# Patient Record
Sex: Male | Born: 1965
Health system: Southern US, Community
[De-identification: ages and names within clinical notes are randomized; demographics above are authoritative.]

## PROBLEM LIST (undated history)

## (undated) DIAGNOSIS — K089 Disorder of teeth and supporting structures, unspecified: Secondary | ICD-10-CM

## (undated) DIAGNOSIS — Z7289 Other problems related to lifestyle: Secondary | ICD-10-CM

## (undated) HISTORY — PX: HERNIA REPAIR: SHX51

---
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
## 2000-05-30 ENCOUNTER — Emergency Department (HOSPITAL_COMMUNITY): Admission: EM | Admit: 2000-05-30 | Discharge: 2000-05-30 | Payer: Self-pay | Admitting: Internal Medicine

## 2001-05-13 ENCOUNTER — Encounter: Payer: Self-pay | Admitting: General Surgery

## 2001-05-13 ENCOUNTER — Encounter: Admission: RE | Admit: 2001-05-13 | Discharge: 2001-05-13 | Payer: Self-pay | Admitting: General Surgery

## 2001-05-30 ENCOUNTER — Ambulatory Visit (HOSPITAL_BASED_OUTPATIENT_CLINIC_OR_DEPARTMENT_OTHER): Admission: RE | Admit: 2001-05-30 | Discharge: 2001-05-30 | Payer: Self-pay | Admitting: General Surgery

## 2001-05-30 ENCOUNTER — Encounter (INDEPENDENT_AMBULATORY_CARE_PROVIDER_SITE_OTHER): Payer: Self-pay

## 2004-06-15 ENCOUNTER — Emergency Department (HOSPITAL_COMMUNITY): Admission: EM | Admit: 2004-06-15 | Discharge: 2004-06-15 | Payer: Self-pay | Admitting: Family Medicine

## 2005-04-27 ENCOUNTER — Emergency Department (HOSPITAL_COMMUNITY): Admission: EM | Admit: 2005-04-27 | Discharge: 2005-04-27 | Payer: Self-pay | Admitting: Emergency Medicine

## 2009-08-19 ENCOUNTER — Ambulatory Visit: Payer: Self-pay | Admitting: Internal Medicine

## 2009-09-19 ENCOUNTER — Emergency Department (HOSPITAL_COMMUNITY): Admission: EM | Admit: 2009-09-19 | Discharge: 2009-09-19 | Payer: Self-pay | Admitting: Emergency Medicine

## 2009-09-27 ENCOUNTER — Encounter (INDEPENDENT_AMBULATORY_CARE_PROVIDER_SITE_OTHER): Payer: Self-pay | Admitting: *Deleted

## 2010-05-10 NOTE — Letter (Signed)
Summary: Unable to Reach, Consult Scheduled  [facility]  [address]   [place], [place] [address]   Phone: [phone]  Fax: [phone]    09/27/2009  KYLYN SOOKRAM [address] [place], [place]  [address] 07-17-1965   Dear Mr. Dike,   We have been unable to reach you by phone.  Please contact our office with an updated phone number.  At the recommendation of DR [facility]  we have been asked to schedule you a consult with DR Jena Gauss for DYSPHAGIA. Please call our office at [phone].     Thank you,    Diana Eves  [facility] R. Roetta Sessions, M.D.    Jonette Eva, M.D. Lorenza Burton, FNP-BC    Tana Coast, PA-C Phone: [phone]    Fax: [phone]

## 2010-06-27 LAB — POCT I-STAT, CHEM 8
BUN: 14 mg/dL (ref 6–23)
Calcium, Ion: 1.23 mmol/L (ref 1.12–1.32)
Chloride: 110 mEq/L (ref 96–112)
Creatinine, Ser: 1 mg/dL (ref 0.4–1.5)
Glucose, Bld: 85 mg/dL (ref 70–99)
HCT: 51 % (ref 39.0–52.0)
Hemoglobin: 17.3 g/dL — ABNORMAL HIGH (ref 13.0–17.0)
Potassium: 4.2 mEq/L (ref 3.5–5.1)
Sodium: 143 mEq/L (ref 135–145)
TCO2: 26 mmol/L (ref 0–100)

## 2010-06-27 LAB — RAPID STREP SCREEN (MED CTR MEBANE ONLY): Streptococcus, Group A Screen (Direct): NEGATIVE

## 2010-06-27 LAB — POCT CARDIAC MARKERS

## 2010-08-26 NOTE — Op Note (Signed)
[facility]  Patient:    Edward Delgado, Edward Delgado Visit Number: [id number] MRN: [id number]          Service Type: DSU Location: [facility] Attending Physician:  Arlis Porta Dictated by:   Adolph Pollack, M.D. Proc. Date: 05/30/01 Admit Date:  05/30/2001 Discharge Date: 05/30/2001                             Operative Report  PREOPERATIVE DIAGNOSIS:  Abdominal wall mass.  POSTOPERATIVE DIAGNOSIS:  Abdominal wall abscess.  PROCEDURE:  Excision and drainage of abdominal wall abscess.  SURGEON:  Adolph Pollack, M.D.  ANESTHESIA:  General.  INDICATIONS:  The patient is a 45 year old man who has noticed for the past two or three years that he has a bulge in the supraumbilical area.  Of note, he said he had an operation 10-11 years ago for an incarcerated umbilical hernia requiring small bowel resection.  He noted some redness of the bulge back in late January.  No fever or chills present.  No nausea or vomiting.  No change in bowel habits.  A CT scan demonstrated this to be a suprafascial process.  It was thought that this could potentially be consistent with a periumbilical abscess or an infected cystic lesion.  He is now brought to the operating room.  DESCRIPTION OF PROCEDURE:  He was placed supine on the operating table and general anesthetic was administered.  The abdomen was sterilely prepped and draped.  The mass was palpated with discolored skin and an elliptical incision made around it.  Purulent drainage was noted to come forth.  There was a very firm stalk connected to the mass that went down to the fascial area, but did not go through the fascia.  I excised this with the cautery and excised the mass in toto.  This appeared to be an abscessed cavity, but it did not appear to have a connection to the intra-abdominal contents.  I did excise the entire inflammatory mass and abscessed cavity, and sent it off to pathology.  Next,  because of the purulent drainage, I packed the wound open with moist gauze, and then covered it with a dry bulky dressing.  He tolerated the procedure well without any apparent complications and was taken to the recovery room in satisfactory condition.  Postoperatively, he will see me on his first postoperative day for a dressing change, and we will let the wound heal in by secondary intention. Dictated by:   Adolph Pollack, M.D. Attending Physician:  Arlis Porta DD:  05/30/01 TD:  05/31/01 Job: [id number]

## 2012-05-16 ENCOUNTER — Encounter (HOSPITAL_COMMUNITY): Payer: Self-pay | Admitting: Emergency Medicine

## 2012-05-16 ENCOUNTER — Emergency Department (HOSPITAL_COMMUNITY)
Admission: EM | Admit: 2012-05-16 | Discharge: 2012-05-16 | Disposition: A | Discharge status: Home / Self Care | Payer: Worker's Compensation | Attending: Emergency Medicine | Admitting: Emergency Medicine

## 2012-05-16 DIAGNOSIS — Y990 Civilian activity done for income or pay: Secondary | ICD-10-CM | POA: Insufficient documentation

## 2012-05-16 DIAGNOSIS — S058X9A Other injuries of unspecified eye and orbit, initial encounter: Secondary | ICD-10-CM | POA: Insufficient documentation

## 2012-05-16 DIAGNOSIS — H18899 Other specified disorders of cornea, unspecified eye: Secondary | ICD-10-CM

## 2012-05-16 DIAGNOSIS — Y9389 Activity, other specified: Secondary | ICD-10-CM | POA: Insufficient documentation

## 2012-05-16 DIAGNOSIS — T1590XA Foreign body on external eye, part unspecified, unspecified eye, initial encounter: Secondary | ICD-10-CM | POA: Insufficient documentation

## 2012-05-16 DIAGNOSIS — Y9289 Other specified places as the place of occurrence of the external cause: Secondary | ICD-10-CM | POA: Insufficient documentation

## 2012-05-16 DIAGNOSIS — H53149 Visual discomfort, unspecified: Secondary | ICD-10-CM | POA: Insufficient documentation

## 2012-05-16 DIAGNOSIS — T1500XA Foreign body in cornea, unspecified eye, initial encounter: Secondary | ICD-10-CM | POA: Insufficient documentation

## 2012-05-16 MED ORDER — OXYCODONE-ACETAMINOPHEN 5-325 MG PO TABS
1.0000 | ORAL_TABLET | Freq: Once | ORAL | Status: AC
  Administered 2012-05-16: 1 via ORAL
  Filled 2012-05-16: qty 1

## 2012-05-16 MED ORDER — TETRACAINE HCL 0.5 % OP SOLN
OPHTHALMIC | Status: AC
  Administered 2012-05-16: 09:00:00
  Filled 2012-05-16: qty 2

## 2012-05-16 MED ORDER — FLUORESCEIN SODIUM 1 MG OP STRP
ORAL_STRIP | OPHTHALMIC | Status: AC
  Administered 2012-05-16: 09:00:00
  Filled 2012-05-16: qty 1

## 2012-05-16 MED ORDER — HOMATROPINE HBR 5 % OP SOLN: 1.0000 [drp] | Freq: Once | OPHTHALMIC | Status: DC

## 2012-05-16 NOTE — ED Notes (Signed)
Pt states was at work and thinks a piece of metal got in eye. Pt was wearing safety glasses.watering, pain and discharged noted.

## 2012-05-16 NOTE — ED Provider Notes (Signed)
Medical screening examination/treatment/procedure(s) were performed by non-physician practitioner and as supervising physician I was immediately available for consultation/collaboration.   Carleene Cooper III, MD 05/16/12 2029

## 2012-05-16 NOTE — ED Notes (Signed)
Patient with no complaints at this time. Respirations even and unlabored. Skin warm/dry. Discharge instructions reviewed with patient at this time. Patient given opportunity to voice concerns/ask questions. Patient discharged at this time and left Emergency Department with steady gait. Patient to go straight to Dr Lita Mains Office in [place]. Directions printed and given to patient/family member.

## 2012-05-16 NOTE — ED Notes (Signed)
Awaiting medication from pharmacy.

## 2012-05-16 NOTE — ED Notes (Addendum)
Slit lamp, wood's lamp, tetracaine, and fluorescein strip pulled to bedside for PA assessment.

## 2012-05-16 NOTE — ED Provider Notes (Addendum)
History     CSN: [id number]  Arrival date & time 05/16/12  0818   First MD Initiated Contact with Patient 05/16/12 [phone]      Chief Complaint  Patient presents with  . Foreign Body in Eye    (Consider location/radiation/quality/duration/timing/severity/associated sxs/prior treatment) HPI Comments: Patient c/o foreign body sensation to the left eye after using a torch to cut metal.  States he was wearing safety glasses at the time.  C/o severe pain to his eye, excessive tearing and swelling of the upper eyelid.  States he rinsed his eye with water at the time of the incident.  He denies visual changes, fever, vomiting , headache or contact use.    Patient is a 47 y.o. male presenting with foreign body in eye. The history is provided by the patient.  Foreign Body in Eye This is a new problem. Episode onset: 2 days ago. The problem occurs constantly. The problem has been gradually worsening. Pertinent negatives include no fever, headaches, neck pain, numbness, rash, sore throat, swollen glands, vertigo, vomiting or weakness. Exacerbated by: bright light and blinking. Treatments tried: visine, rinsing his eye with water. The treatment provided no relief.    History reviewed. No pertinent past medical history.  Past Surgical History  Procedure Date  . Hernia repair     No family history on file.  History  Substance Use Topics  . Smoking status: Not on file  . Smokeless tobacco: Not on file  . Alcohol Use:       Review of Systems  Constitutional: Negative for fever, activity change and appetite change.  HENT: Negative for sore throat, facial swelling and neck pain.   Eyes: Positive for photophobia, pain and redness. Negative for discharge, itching and visual disturbance.  Gastrointestinal: Negative for vomiting.  Skin: Negative for rash.  Neurological: Negative for dizziness, vertigo, syncope, weakness, numbness and headaches.  All other systems reviewed and are  negative.    Allergies  Review of patient's allergies indicates no known allergies.  Home Medications  No current outpatient prescriptions on file.  BP 150/92  Pulse 83  Temp 99.2 F (37.3 C) (Oral)  Resp 20  Ht 5\' 6"  (1.676 m)  Wt 170 lb (77.111 kg)  BMI 27.44 kg/m2  SpO2 96%  Physical Exam  Nursing note and vitals reviewed. Constitutional: He is oriented to person, place, and time. He appears well-developed and well-nourished.       Appears uncomfortable  HENT:  Head: Normocephalic and atraumatic.  Eyes: EOM are normal. Pupils are equal, round, and reactive to light. Left eye exhibits no chemosis, no discharge, no exudate and no hordeolum. Foreign body present in the left eye. Right conjunctiva is not injected. Left conjunctiva is injected. Left conjunctiva has no hemorrhage. Right eye exhibits normal extraocular motion and no nystagmus. Left eye exhibits normal extraocular motion and no nystagmus.  Fundoscopic exam:      The left eye shows no papilledema.  Slit lamp exam:      The left eye shows corneal abrasion, foreign body and fluorescein uptake. The left eye shows no corneal ulcer and no hyphema.    Lids were everted and no FB seen  Neck: Normal range of motion. Neck supple.  Cardiovascular: Normal rate, regular rhythm, normal heart sounds and intact distal pulses.   No murmur heard. Pulmonary/Chest: Effort normal and breath sounds normal.  Musculoskeletal: Normal range of motion.  Lymphadenopathy:    He has no cervical adenopathy.  Neurological: He is alert  and oriented to person, place, and time. He exhibits normal muscle tone. Coordination normal.  Skin: Skin is warm and dry.    ED Course  FOREIGN BODY REMOVAL Date/Time: 05/16/2012 9:00 AM Performed by: Trisha Mangle, Kalyiah Saintil L. Authorized by: Maxwell Caul Consent: Verbal consent obtained. written consent not obtained. Risks and benefits: risks, benefits and alternatives were discussed Consent given by:  patient Patient understanding: patient states understanding of the procedure being performed Patient consent: the patient's understanding of the procedure matches consent given Patient identity confirmed: verbally with patient and arm band Time out: Immediately prior to procedure a "time out" was called to verify the correct patient, procedure, equipment, support staff and site/side marked as required. Body area: eye Location details: left conjunctiva Anesthesia method: topical. Local anesthetic: tetracaine drops Anesthetic total: 8 drops Patient sedated: no Patient restrained: no Patient cooperative: yes Localization method: magnification Removal mechanism: moist cotton swab Eye examined with fluorescein. Fluorescein uptake. Corneal abrasion size: small Corneal abrasion location: lateral Residual rust ring present. Depth: superficial Complexity: simple 1 objects recovered. Post-procedure assessment: foreign body removed Patient tolerance: Patient tolerated the procedure well with no immediate complications.   (including critical care time)  Labs Reviewed - No data to display No results found.    Visual acuity OD:  20/20  OS: 20/20  Bilateral :20/20  MDM    FB removed from the left eye, rust ring remains.  Will Consult Dr. Lita Mains to arrange f/u care   Tetracaine drops applied,  and po percocet given in the dept.  (homatropine ordered but none in hospital, been backordered per pharmacy)  10:25 consulted Dr. Lita Mains.  Agrees to see pt in his office now or at 1:30 pm today.        Edward Evrard L. Emori Mumme, PA 05/16/12 1033  Edward Terrio L. Topanga Alvelo, PA 05/16/12 1053  Edward Delgado, [place] 05/29/12 2109

## 2012-05-30 NOTE — ED Provider Notes (Signed)
Medical screening examination/treatment/procedure(s) were performed by non-physician practitioner and as supervising physician I was immediately available for consultation/collaboration.   Carleene Cooper III, MD 05/30/12 [phone]

## 2015-02-22 ENCOUNTER — Encounter (HOSPITAL_COMMUNITY): Payer: Self-pay | Admitting: *Deleted

## 2015-02-22 ENCOUNTER — Emergency Department (HOSPITAL_COMMUNITY)
Admission: EM | Admit: 2015-02-22 | Discharge: 2015-02-22 | Disposition: A | Discharge status: Home / Self Care | Payer: 59 | Attending: Emergency Medicine | Admitting: Emergency Medicine

## 2015-02-22 DIAGNOSIS — Z87828 Personal history of other (healed) physical injury and trauma: Secondary | ICD-10-CM | POA: Diagnosis not present

## 2015-02-22 DIAGNOSIS — M25562 Pain in left knee: Secondary | ICD-10-CM | POA: Diagnosis present

## 2015-02-22 MED ORDER — HYDROCODONE-ACETAMINOPHEN 5-325 MG PO TABS: 1.0000 | ORAL_TABLET | ORAL | Status: DC | PRN

## 2015-02-22 NOTE — ED Provider Notes (Signed)
CSN: [id number]     Arrival date & time 02/22/15  1216 History  By signing my name below, I, Ronney LionSuzanne Le, attest that this documentation has been prepared under the direction and in the presence of [facility] Hospital Medical CenterEmily Kmya Placide, PA-C. Electronically Signed: Ronney LionSuzanne Le, ED Scribe. 02/22/2015. 4:26 PM.    Chief Complaint  Patient presents with  . Knee Pain   The history is provided by the patient. No language interpreter was used.   HPI Comments: Edward Delgado is a 49 y.o. male who presents to the Emergency Department complaining of diffuse, pulling, severe left knee pain that began 2 months ago after twisting his knee while falling off of a tractor-trailer truck at work. Patient was seen for this at his PCP's office 2 months ago and had x-rays done that were negative. He was diagnosed with a sprain at the time but since being seen, complains of continuing pain and swelling. He states his left knee clicks when he walks. His significant other states he has had trouble sleeping secondary to his discomfort and often tosses and turns at night. Certain movements exacerbate his pain. He has taken Tylenol, Advil, and Goody's powder with no relief. He denies fever, back pain, weakness, or numbness. He states he has never seen an orthopedist. 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  History reviewed. No pertinent past medical history. Past Surgical History  Procedure Laterality Date  . Hernia repair     History reviewed. No pertinent family history. Social History  Substance Use Topics  . Smoking status: Never Smoker   . Smokeless tobacco: None  . Alcohol Use: Yes     Comment: occ    Review of Systems  Constitutional: Negative for fever.  Cardiovascular: Negative for leg swelling.  Musculoskeletal: Positive for arthralgias. Negative for myalgias and back pain.  Skin: Negative for color change and wound.  Allergic/Immunologic: Negative for  immunocompromised state.  Neurological: Negative for weakness and numbness.  Hematological: Does not bruise/bleed easily.  Psychiatric/Behavioral: Negative for self-injury.    Allergies  Review of patient's allergies indicates no known allergies.  Home Medications   Prior to Admission medications   Not on File   BP 157/93 mmHg  Pulse 92  Temp(Src) 98.3 F (36.8 C) (Oral)  Resp 16  SpO2 98% Physical Exam  Constitutional: He appears well-developed and well-nourished. No distress.  HENT:  Head: Normocephalic and atraumatic.  Neck: Neck supple.  Pulmonary/Chest: Effort normal.  Musculoskeletal: He exhibits tenderness.  Thessaly test is positive. Mild pain with anterior,posteroir, varus, and valgus stressing, without laxity. No erythema, edema, warmth or focal TTP. Distal pulses and sensation are intact. Compartments are soft.   Neurological: He is alert.  Skin: He is not diaphoretic.  Nursing note and vitals reviewed.   ED Course  Procedures (including critical care time)  DIAGNOSTIC STUDIES: Oxygen Saturation is 98% on RA, normal by my interpretation.    COORDINATION OF CARE: 1:23 PM - Discussed treatment plan with pt at bedside which includes crutches and referral to orthopedist for f/u. Will give work note. Ptverbalized understanding and agreed to plan.   MDM   Final diagnoses:  Left knee pain   Afebrile, nontoxic patient with injury to his left knee while twisting it while falling from a truck two months ago.  Persistent pain.   Xray performed by PCP was negative.  Suspect meniscal injury.   D/C home with knee sleeve, crutches,  vicodin, orthopedic follow up.   Discussed result, findings, treatment, and follow up  with patient.  Pt given return precautions.  Pt verbalizes understanding and agrees with plan.     I personally performed the services described in this documentation, which was scribed in my presence. The recorded information has been reviewed and is  accurate.   Trixie Dredge, PA-C 02/22/15 2706  Gerhard Munch, MD 02/23/15 [phone]

## 2015-02-22 NOTE — ED Notes (Signed)
Pt reports injuring left knee two months ago and was seen at pcp and had xrays done, was told it was sprained but still having pain and swelling.

## 2015-02-22 NOTE — Discharge Instructions (Signed)
Read the information below.  Use the prescribed medication as directed.  Please discuss all new medications with your pharmacist.  Do not take additional tylenol while taking the prescribed pain medication to avoid overdose.  You may return to the Emergency Department at any time for worsening condition or any new symptoms that concern you.   If you develop uncontrolled pain, weakness or numbness of the extremity, severe discoloration of the skin, or you are unable to walk, return to the ER for a recheck.      How to Use a Knee Brace A knee brace is a device that you wear to support your knee, especially if the knee is healing after an injury or surgery. There are several types of knee braces. Some are designed to prevent an injury (prophylactic brace). These are often worn during sports. Others support an injured knee (functional brace) or keep it still while it heals (rehabilitative brace). People with severe arthritis of the knee may benefit from a brace that takes some pressure off the knee (unloader brace). Most knee braces are made from a combination of cloth and metal or plastic.  You may need to wear a knee brace to:  Relieve knee pain.  Help your knee support your weight (improve stability).  Help you walk farther (improve mobility).  Prevent injury.  Support your knee while it heals from surgery or from an injury. RISKS AND COMPLICATIONS Generally, knee braces are very safe to wear. However, problems may occur, including:  Skin irritation that may lead to infection.  Making your condition worse if you wear the brace in the wrong way. HOW TO USE A KNEE BRACE Different braces will have different instructions for use. Your health care provider will tell you or show you:  How to put on your brace.  How to adjust the brace.  When and how often to wear the brace.  How to remove the brace.  If you will need any assistive devices in addition to the brace, such as crutches or a  cane. In general, your brace should:  Have the hinge of the brace line up with the bend of your knee.  Have straps, hooks, or tapes that fasten snugly around your leg.  Not feel too tight or too loose. HOW TO CARE FOR A KNEE BRACE  Check your brace often for signs of damage, such as loose connections or attachments. Your knee brace may get damaged or wear out during normal use.  Wash the fabric parts of your brace with soap and water.  Read the insert that comes with your brace for other specific care instructions. SEEK MEDICAL CARE IF:  Your knee brace is too loose or too tight and you cannot adjust it.  Your knee brace causes skin redness, swelling, bruising, or irritation.  Your knee brace is not helping.  Your knee brace is making your knee pain worse.   This information is not intended to replace advice given to you by your health care provider. Make sure you discuss any questions you have with your health care provider.   Document Released: 06/17/2003 Document Revised: 12/16/2014 Document Reviewed: 07/20/2014 Elsevier Interactive Patient Education 2016 Elsevier Inc.  Knee Pain Knee pain is a very common symptom and can have many causes. Knee pain often goes away when you follow your health care provider's instructions for relieving pain and discomfort at home. However, knee pain can develop into a condition that needs treatment. Some conditions may include:  Arthritis caused  by wear and tear (osteoarthritis).  Arthritis caused by swelling and irritation (rheumatoid arthritis or gout).  A cyst or growth in your knee.  An infection in your knee joint.  An injury that will not heal.  Damage, swelling, or irritation of the tissues that support your knee (torn ligaments or tendinitis). If your knee pain continues, additional tests may be ordered to diagnose your condition. Tests may include X-rays or other imaging studies of your knee. You may also need to have fluid  removed from your knee. Treatment for ongoing knee pain depends on the cause, but treatment may include:  Medicines to relieve pain or swelling.  Steroid injections in your knee.  Physical therapy.  Surgery. HOME CARE INSTRUCTIONS  Take medicines only as directed by your health care provider.  Rest your knee and keep it raised (elevated) while you are resting.  Do not do things that cause or worsen pain.  Avoid high-impact activities or exercises, such as running, jumping rope, or doing jumping jacks.  Apply ice to the knee area:  Put ice in a plastic bag.  Place a towel between your skin and the bag.  Leave the ice on for 20 minutes, 2-3 times a day.  Ask your health care provider if you should wear an elastic knee support.  Keep a pillow under your knee when you sleep.  Lose weight if you are overweight. Extra weight can put pressure on your knee.  Do not use any tobacco products, including cigarettes, chewing tobacco, or electronic cigarettes. If you need help quitting, ask your health care provider. Smoking may slow the healing of any bone and joint problems that you may have. SEEK MEDICAL CARE IF:  Your knee pain continues, changes, or gets worse.  You have a fever along with knee pain.  Your knee buckles or locks up.  Your knee becomes more swollen. SEEK IMMEDIATE MEDICAL CARE IF:   Your knee joint feels hot to the touch.  You have chest pain or trouble breathing.   This information is not intended to replace advice given to you by your health care provider. Make sure you discuss any questions you have with your health care provider.   Document Released: 01/22/2007 Document Revised: 04/17/2014 Document Reviewed: 11/10/2013 Elsevier Interactive Patient Education [employer] Elsevier Inc.

## 2015-03-11 ENCOUNTER — Other Ambulatory Visit: Payer: Self-pay | Admitting: Orthopedic Surgery

## 2015-03-11 DIAGNOSIS — M25561 Pain in right knee: Secondary | ICD-10-CM

## 2015-03-22 ENCOUNTER — Ambulatory Visit
Admission: RE | Admit: 2015-03-22 | Discharge: 2015-03-22 | Disposition: A | Discharge status: Home / Self Care | Payer: 59 | Source: Ambulatory Visit | Attending: Orthopedic Surgery | Admitting: Orthopedic Surgery

## 2015-03-22 DIAGNOSIS — M25561 Pain in right knee: Secondary | ICD-10-CM

## 2015-03-27 ENCOUNTER — Ambulatory Visit
Admission: RE | Admit: 2015-03-27 | Discharge: 2015-03-27 | Disposition: A | Discharge status: Home / Self Care | Payer: 59 | Source: Ambulatory Visit | Attending: Orthopedic Surgery | Admitting: Orthopedic Surgery

## 2015-03-27 DIAGNOSIS — M25561 Pain in right knee: Secondary | ICD-10-CM

## 2016-05-07 IMAGING — MR MR KNEE*L* W/O CM
5 of 6 series · 33 of 40 positions shown · non-contrast
Comparison: None.

CLINICAL DATA: Medial knee pain since falling from a tractor at
work 4 months ago. Weakness with limited weight-bearing. No previous
relevant surgery. Initial encounter.

EXAM:
MRI OF THE LEFT KNEE WITHOUT CONTRAST
TECHNIQUE: Multiplanar, multisequence MR imaging of the knee was performed. No
intravenous contrast was administered.

[Series 6: PD fat-sat · axial · left · 3.0mm · 0.39mm/px · z∈[+10,+125]mm · 8 of 36 slices shown (1 of 3)]
[im 1/36]
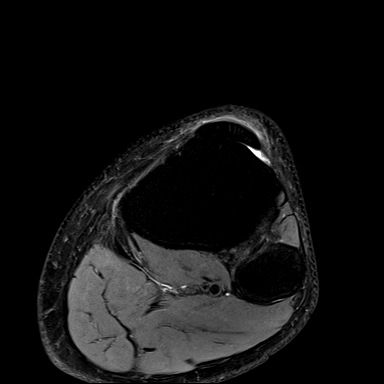
[im 6/36]
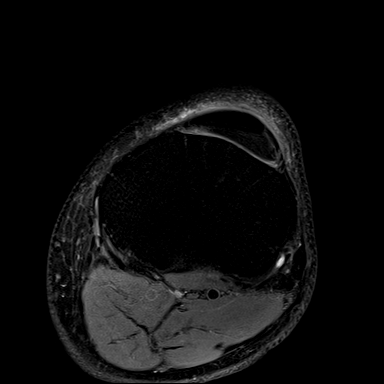
[im 11/36]
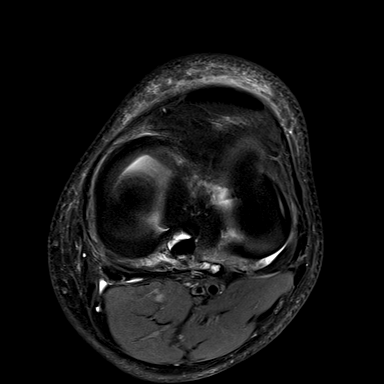
[im 16/36]
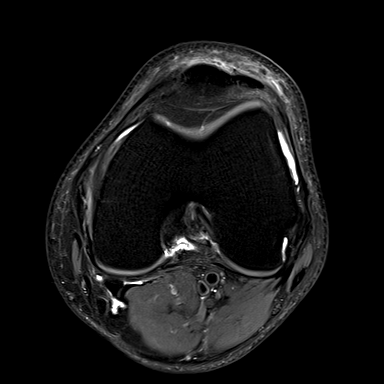
[im 21/36]
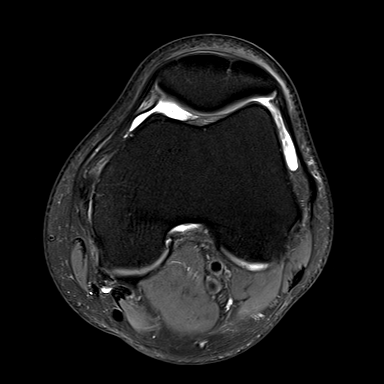
[im 26/36]
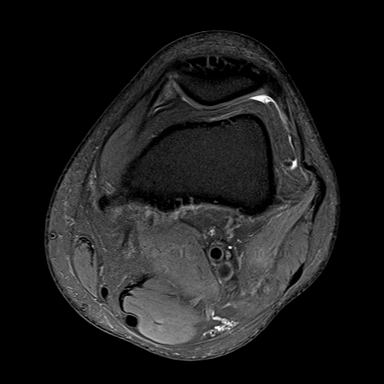
[im 31/36]
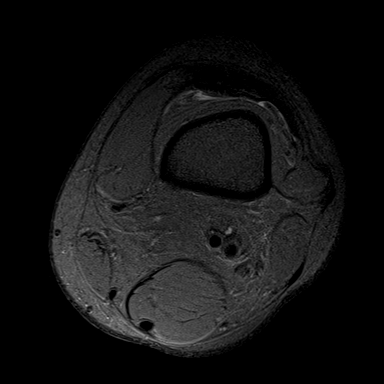
[im 36/36]
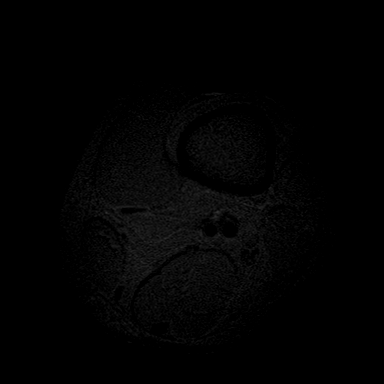

[Series 8: PD fat-sat · coronal · left · 3.0mm · 0.33mm/px · 7 of 33 slices shown (2 of 3)]
[im 1/33]
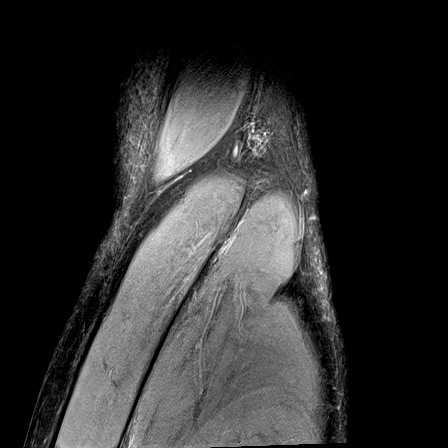
[im 6/33]
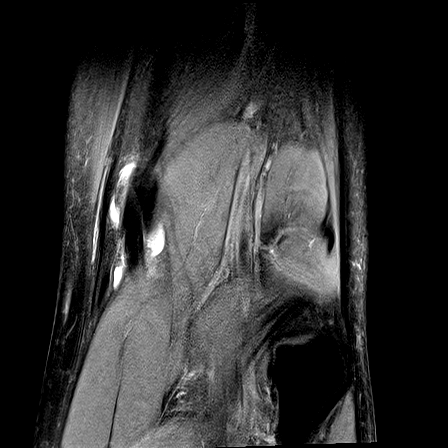
[im 11/33]
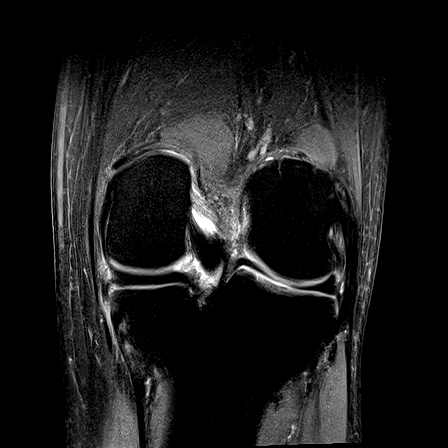
[im 17/33]
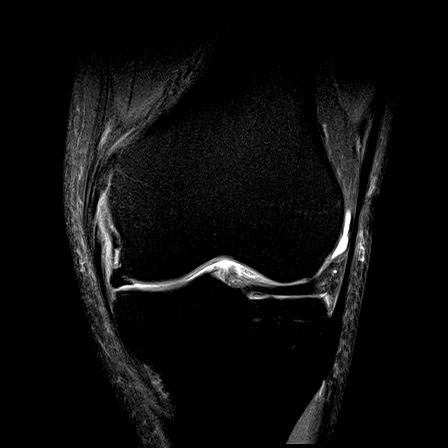
[im 22/33]
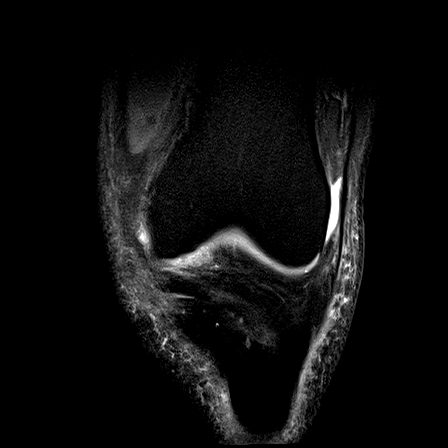
[im 27/33]
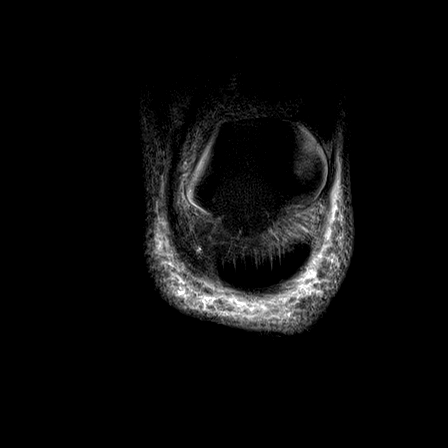
[im 33/33]
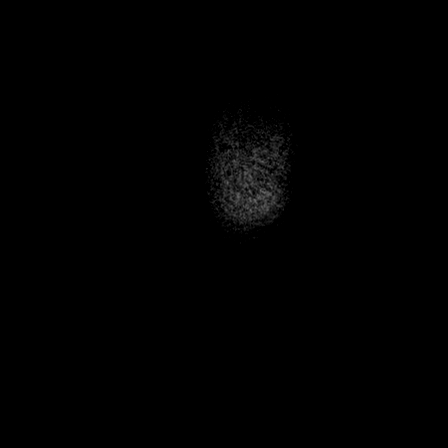

[Series 9: PD fat-sat · sagittal · left · 3.0mm · 0.39mm/px · 6 of 27 slices shown (3 of 3)]
[im 1/27]
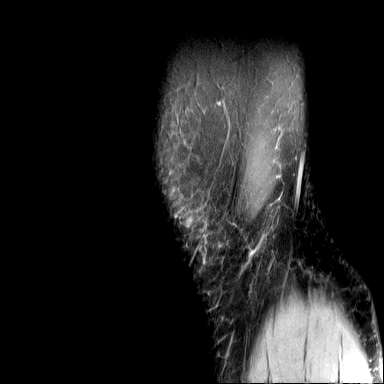
[im 6/27]
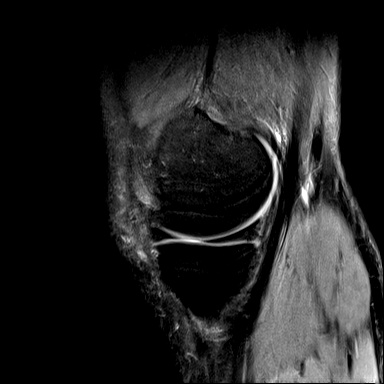
[im 11/27]
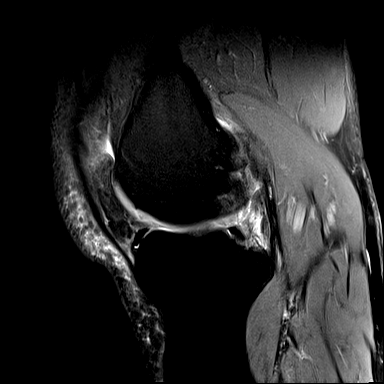
[im 16/27]
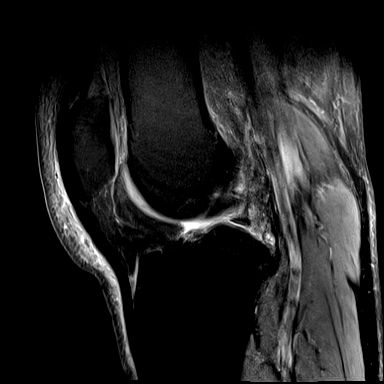
[im 21/27]
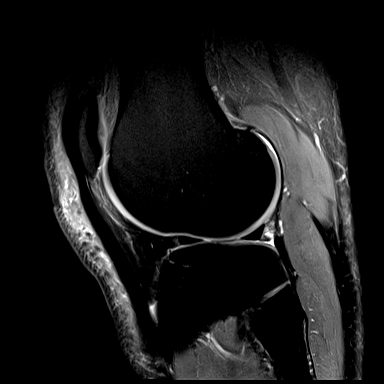
[im 27/27]
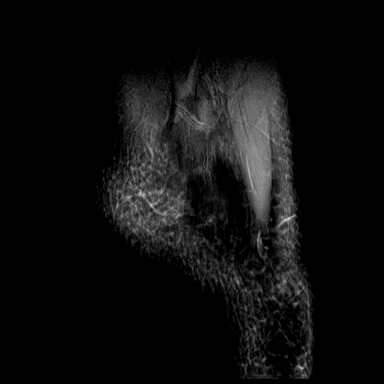

[Series 10: T2 fat-sat · coronal · left · 3.0mm · 0.39mm/px · 7 of 33 slices shown]
[im 1/33]
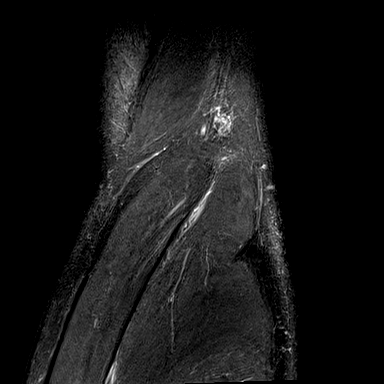
[im 6/33]
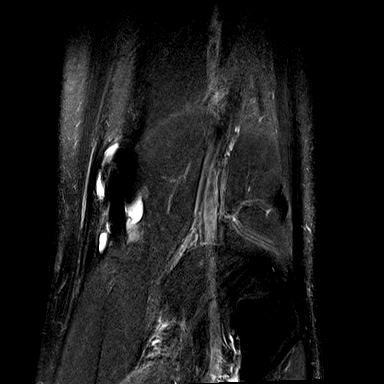
[im 11/33]
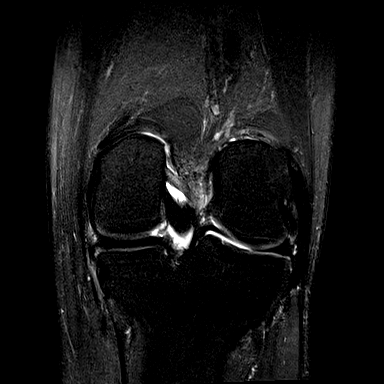
[im 17/33]
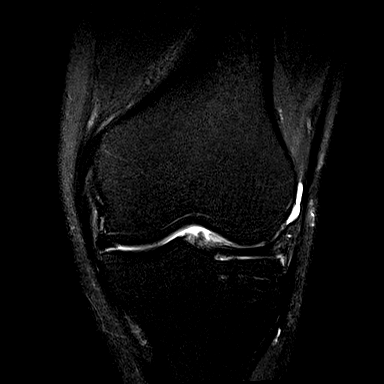
[im 22/33]
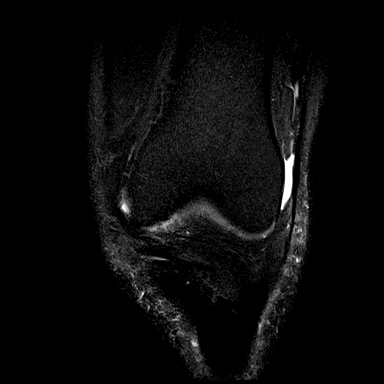
[im 27/33]
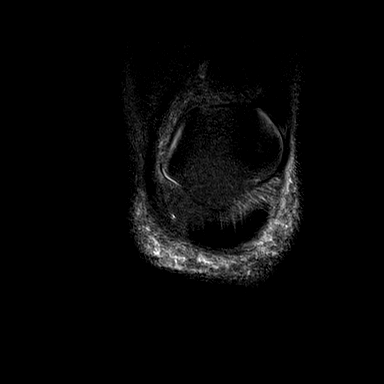
[im 33/33]
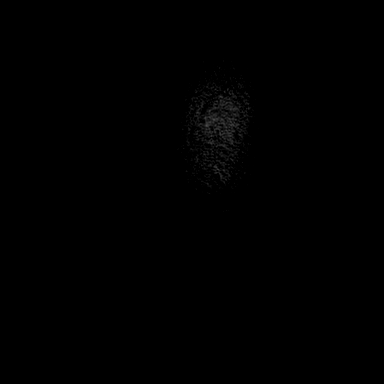

[Series 11: PD · oblique · left · 1.5mm · 0.44mm/px · 5 of 21 slices shown]
[im 1/21]
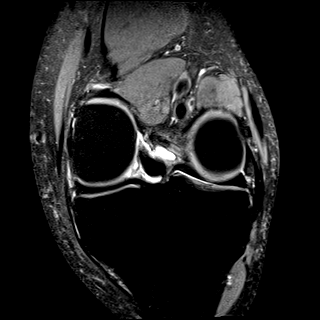
[im 6/21]
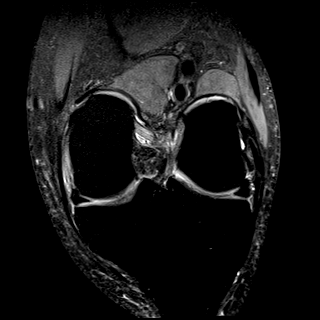
[im 11/21]
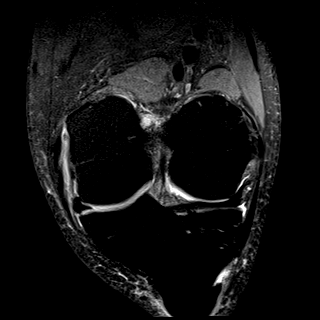
[im 16/21]
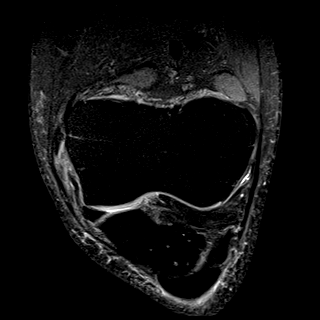
[im 21/21]
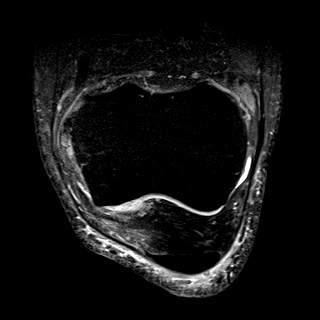

[33 of 40 positions shown; findings below may reference images not displayed]

FINDINGS: Despite efforts by the technologist and patient, motion artifact is
present on today's exam and could not be eliminated. This reduces
exam sensitivity and specificity.

MENISCI

Medial meniscus:  Intact with normal morphology.

Lateral meniscus:  Intact with normal morphology.

LIGAMENTS

Cruciates:  Intact.

Collaterals: The medial collateral ligament is thickened and
edematous proximally, likely representing a healing grade 2 injury.
No full-thickness tear or ligament retraction identified. The
lateral collateral ligament complex appears normal.

CARTILAGE

Patellofemoral:  Preserved.

Medial:  Preserved.

Lateral:  Preserved.

OTHER

Joint:  No significant joint effusion.

Popliteal Fossa:  Small Baker's cyst.

Extensor Mechanism: Intact. There is moderate prepatellar
subcutaneous edema and ill-defined fluid.

Bones:  No acute or significant extra-articular osseous findings.
IMPRESSION: 1. Findings suggest a healing grade 2 MCL sprain.
2. Moderate prepatellar subcutaneous edema, possibly reflecting
posttraumatic bursitis.
3. No acute osseous findings. The menisci and cruciate ligaments
appear normal.

## 2017-07-03 DIAGNOSIS — R030 Elevated blood-pressure reading, without diagnosis of hypertension: Secondary | ICD-10-CM | POA: Diagnosis not present

## 2017-07-03 DIAGNOSIS — M25522 Pain in left elbow: Secondary | ICD-10-CM | POA: Diagnosis not present

## 2017-08-20 DIAGNOSIS — M25561 Pain in right knee: Secondary | ICD-10-CM | POA: Diagnosis not present

## 2017-08-29 ENCOUNTER — Ambulatory Visit (INDEPENDENT_AMBULATORY_CARE_PROVIDER_SITE_OTHER): Payer: 59 | Admitting: Orthopedic Surgery

## 2017-08-29 ENCOUNTER — Ambulatory Visit (INDEPENDENT_AMBULATORY_CARE_PROVIDER_SITE_OTHER): Payer: 59

## 2017-08-29 ENCOUNTER — Encounter (INDEPENDENT_AMBULATORY_CARE_PROVIDER_SITE_OTHER): Payer: Self-pay | Admitting: Orthopedic Surgery

## 2017-08-29 DIAGNOSIS — M25561 Pain in right knee: Secondary | ICD-10-CM | POA: Diagnosis not present

## 2017-08-29 DIAGNOSIS — G8929 Other chronic pain: Secondary | ICD-10-CM | POA: Diagnosis not present

## 2017-08-29 NOTE — Progress Notes (Signed)
Office Visit Note   Patient: Edward Delgado           Date of Birth: 23-Oct-1965           MRN: [id number] Visit Date: 08/29/2017 Requested by: No referring provider defined for this encounter. PCP: Patient, No Pcp Per  Subjective: Chief Complaint  Patient presents with  . Right Knee - Pain    HPI: Edward Delgado is a patient with right knee pain.  I saw him in 2016.  Left knee was symptomatic at that time but MRI scan showed no arthritis only MCL sprain.  He has had cortisone injections which did not help.  Reports pain swelling popping and crunching in the right knee particularly on the medial side.  Went to urgent care and also went to [facility] 08/20/2017.  There he was told that he has bone-on-bone arthritis and may need knee replacement.  This is per patient history.  Not cooperated in the medical record although I could not find a note.  Voltaren has been tried which has not helped.  Reports chronic pain.  Uses ice and heat.  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              ROS: All systems reviewed are negative as they relate to the chief complaint within the history of present illness.  Patient denies  fevers or chills.   Assessment & Plan: Visit Diagnoses:  1. Chronic pain of right knee     Plan: Impression is fairly normal right knee exam and radiographs.  Pain is definitely more severe by history then it is by objective findings today.  Has been going on for many months and he has failed conservative management.  Need MRI scan right knee to evaluate for medial meniscal tear due to some mechanical symptoms he was having and describing.  I will see him back after that study.  Follow-Up Instructions: Return for after MRI.   Orders:  Orders Placed This Encounter  Procedures  . XR KNEE 3 VIEW RIGHT  . MR Knee Right w/o contrast   No orders of the defined types were placed in this encounter.     Procedures: No procedures performed   Clinical Data: No  additional findings.  Objective: Vital Signs: There were no vitals taken for this visit.  Physical Exam:   Constitutional: Patient appears well-developed HEENT:  Head: Normocephalic Eyes:EOM are normal Neck: Normal range of motion Cardiovascular: Normal rate Pulmonary/chest: Effort normal Neurologic: Patient is alert Skin: Skin is warm Psychiatric: Patient has normal mood and affect    Ortho Exam: Orthopedic exam demonstrates pretty normal gait and alignment with no effusion in either knee.  Range of motion is full with no flexion contracture in the right or left hand side.  Pedal pulses palpable.  Collateral and cruciate ligaments are stable.  No discrete joint line tenderness noted on the right left-hand side.  Specialty Comments:  No specialty comments available.  Imaging: Xr Knee 3 View Right  Result Date: 08/29/2017 AP lateral merchant right knee reviewed.  Fairly minimal degenerative changes present.  Possible mild medial joint space narrowing noted on the lateral view.  No spurring present.  No effusion.  No fracture.    PMFS History: There are no active problems to display for this patient.  History reviewed. No pertinent past medical history.  History reviewed. No pertinent family history.  Past Surgical History:  Procedure Laterality Date  . HERNIA REPAIR  Social History   Occupational History  . Not on file  Tobacco Use  . Smoking status: Never Smoker  Substance and Sexual Activity  . Alcohol use: Yes    Comment: occ  . Drug use: No  . Sexual activity: Not on file

## 2017-09-12 ENCOUNTER — Ambulatory Visit
Admission: RE | Admit: 2017-09-12 | Discharge: 2017-09-12 | Disposition: A | Discharge status: Home / Self Care | Payer: 59 | Source: Ambulatory Visit | Attending: Orthopedic Surgery | Admitting: Orthopedic Surgery

## 2017-09-12 DIAGNOSIS — M25561 Pain in right knee: Principal | ICD-10-CM

## 2017-09-12 DIAGNOSIS — G8929 Other chronic pain: Secondary | ICD-10-CM

## 2017-09-19 ENCOUNTER — Ambulatory Visit (INDEPENDENT_AMBULATORY_CARE_PROVIDER_SITE_OTHER): Payer: 59 | Admitting: Orthopedic Surgery

## 2017-09-19 ENCOUNTER — Encounter (INDEPENDENT_AMBULATORY_CARE_PROVIDER_SITE_OTHER): Payer: Self-pay | Admitting: Orthopedic Surgery

## 2017-09-19 DIAGNOSIS — S838X1D Sprain of other specified parts of right knee, subsequent encounter: Secondary | ICD-10-CM | POA: Diagnosis not present

## 2017-09-22 ENCOUNTER — Encounter (INDEPENDENT_AMBULATORY_CARE_PROVIDER_SITE_OTHER): Payer: Self-pay | Admitting: Orthopedic Surgery

## 2017-09-22 NOTE — Progress Notes (Signed)
   Office Visit Note   Patient: Edward Delgado           Date of Birth: 11/23/1965           MRN: [id number] Visit Date: 09/19/2017 Requested by: No referring provider defined for this encounter. PCP: Patient, No Pcp Per  Subjective: Chief Complaint  Patient presents with  . Right Knee - Follow-up    HPI: Edward Delgado is a patient with right knee pain who presents for evaluation after MRI scan.  MRI scan is reviewed and it does show medial meniscal tear along with some mild chondromalacia on the medial femoral condyle.  All in all he does not have end-stage bone-on-bone knee arthritis.  He reports continued mechanical symptoms and pain on the medial aspect of the knee.  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              ROS: All systems reviewed are negative as they relate to the chief complaint within the history of present illness.  Patient denies  fevers or chills.   Assessment & Plan: Visit Diagnoses:  1. Injury of meniscus of right knee, subsequent encounter     Plan: Impression is symptomatic right knee medial meniscal tear refractory to nonoperative management including injections and activity modification and medication.  I think he would do well with arthroscopy but would need to be out of work probably 4 to 6 weeks from his heavy physical type of duty.  The risk and benefits are discussed including but not limited to infection nerve vessel damage knee stiffness and incomplete pain relief.  Patient understands risk benefits.  All questions answered.  Follow-Up Instructions: No follow-ups on file.   Orders:  No orders of the defined types were placed in this encounter.  No orders of the defined types were placed in this encounter.     Procedures: No procedures performed   Clinical Data: No additional findings.  Objective: Vital Signs: There were no vitals taken for this visit.  Physical Exam:   Constitutional:  Patient appears well-developed HEENT:  Head: Normocephalic Eyes:EOM are normal Neck: Normal range of motion Cardiovascular: Normal rate Pulmonary/chest: Effort normal Neurologic: Patient is alert Skin: Skin is warm Psychiatric: Patient has normal mood and affect    Ortho Exam: Orthopedic exam demonstrates full active and passive range of motion of the right knee with mild effusion medial joint line tenderness stable collateral crucial ligaments and intact extensor mechanism.  Pedal pulses palpable.  No groin pain with internal/external rotation of the leg.  No other masses lymph adenopathy or skin changes noted in that right knee region.  Specialty Comments:  No specialty comments available.  Imaging: No results found.   PMFS History: There are no active problems to display for this patient.  History reviewed. No pertinent past medical history.  History reviewed. No pertinent family history.  Past Surgical History:  Procedure Laterality Date  . HERNIA REPAIR     Social History   Occupational History  . Not on file  Tobacco Use  . Smoking status: Never Smoker  . Smokeless tobacco: Never Used  Substance and Sexual Activity  . Alcohol use: Yes    Comment: occ  . Drug use: No  . Sexual activity: Not on file

## 2017-10-01 ENCOUNTER — Encounter (INDEPENDENT_AMBULATORY_CARE_PROVIDER_SITE_OTHER): Payer: Self-pay | Admitting: Orthopedic Surgery

## 2017-10-01 ENCOUNTER — Inpatient Hospital Stay (INDEPENDENT_AMBULATORY_CARE_PROVIDER_SITE_OTHER): Payer: 59 | Admitting: Orthopedic Surgery

## 2017-10-01 ENCOUNTER — Encounter: Payer: Self-pay | Admitting: Orthopedic Surgery

## 2017-10-01 DIAGNOSIS — M94261 Chondromalacia, right knee: Secondary | ICD-10-CM | POA: Diagnosis not present

## 2017-10-01 DIAGNOSIS — M23221 Derangement of posterior horn of medial meniscus due to old tear or injury, right knee: Secondary | ICD-10-CM | POA: Diagnosis not present

## 2017-10-01 DIAGNOSIS — G8918 Other acute postprocedural pain: Secondary | ICD-10-CM | POA: Diagnosis not present

## 2017-10-03 ENCOUNTER — Telehealth (INDEPENDENT_AMBULATORY_CARE_PROVIDER_SITE_OTHER): Payer: Self-pay | Admitting: Orthopedic Surgery

## 2017-10-03 NOTE — Telephone Encounter (Signed)
Edward Delgado, patients fiance called to see if they could get documentation like a doctors note of patients beginning date out of work and the end date, this is for short term disability. CB # [phone]

## 2017-10-03 NOTE — Telephone Encounter (Signed)
Out of work at least 5 weeks from the time of surgery

## 2017-10-03 NOTE — Telephone Encounter (Signed)
Kathie RhodesBetty, pts girlfriend calling for patient. He is completing forms and needs to know how long he is going to be out of work and the start date of oow..callback @ [phone]

## 2017-10-03 NOTE — Telephone Encounter (Signed)
Please advise. Thanks.  

## 2017-10-03 NOTE — Telephone Encounter (Signed)
See other note. Waiting to be advised by Dr Dean 

## 2017-10-03 NOTE — Telephone Encounter (Signed)
I tried calling, no answer LMVM advising per Dr Dean 

## 2017-10-08 ENCOUNTER — Encounter (INDEPENDENT_AMBULATORY_CARE_PROVIDER_SITE_OTHER): Payer: Self-pay | Admitting: Orthopedic Surgery

## 2017-10-08 ENCOUNTER — Ambulatory Visit (INDEPENDENT_AMBULATORY_CARE_PROVIDER_SITE_OTHER): Payer: 59 | Admitting: Orthopedic Surgery

## 2017-10-08 DIAGNOSIS — S838X1D Sprain of other specified parts of right knee, subsequent encounter: Secondary | ICD-10-CM

## 2017-10-08 DIAGNOSIS — H401123 Primary open-angle glaucoma, left eye, severe stage: Secondary | ICD-10-CM | POA: Diagnosis not present

## 2017-10-08 DIAGNOSIS — H401111 Primary open-angle glaucoma, right eye, mild stage: Secondary | ICD-10-CM | POA: Diagnosis not present

## 2017-10-08 MED ORDER — OXYCODONE HCL 5 MG PO TABS: 5.0000 mg | ORAL_TABLET | Freq: Three times a day (TID) | ORAL | 0 refills | Status: DC | PRN

## 2017-10-09 ENCOUNTER — Encounter (INDEPENDENT_AMBULATORY_CARE_PROVIDER_SITE_OTHER): Payer: Self-pay | Admitting: Orthopedic Surgery

## 2017-10-09 NOTE — Progress Notes (Signed)
   Post-Op Visit Note   Patient: Edward Delgado           Date of Birth: 01/08/1966           MRN: [id number] Visit Date: 10/08/2017 PCP: Patient, No Pcp Per   Assessment & Plan:  Chief Complaint:  Chief Complaint  Patient presents with  . Right Knee - Routine Post Op   Visit Diagnoses:  1. Injury of meniscus of right knee, subsequent encounter     Plan: Edward Delgado is a patient with right knee arthroscopy a week ago.  Partial medial meniscectomy.  On examination he has fairly minimal effusion and flexion past 90.  Plan is to start stationary bike and leg strengthening exercises.  Pain medicine refill x1.  Sutures discontinued.  4-week return for clinical recheck.  Follow-Up Instructions: Return in about 1 month (around 11/05/2017).   Orders:  No orders of the defined types were placed in this encounter.  Meds ordered this encounter  Medications  . oxyCODONE (OXY IR/ROXICODONE) 5 MG immediate release tablet    Sig: Take 1 tablet (5 mg total) by mouth every 8 (eight) hours as needed for severe pain.    Dispense:  30 tablet    Refill:  0    Imaging: No results found.  PMFS History: There are no active problems to display for this patient.  History reviewed. No pertinent past medical history.  History reviewed. No pertinent family history.  Past Surgical History:  Procedure Laterality Date  . HERNIA REPAIR     Social History   Occupational History  . Not on file  Tobacco Use  . Smoking status: Never Smoker  . Smokeless tobacco: Never Used  Substance and Sexual Activity  . Alcohol use: Yes    Comment: occ  . Drug use: No  . Sexual activity: Not on file

## 2017-10-22 ENCOUNTER — Telehealth (INDEPENDENT_AMBULATORY_CARE_PROVIDER_SITE_OTHER): Payer: Self-pay | Admitting: Orthopedic Surgery

## 2017-10-22 MED ORDER — OXYCODONE HCL 5 MG PO TABS: ORAL_TABLET | ORAL | 0 refills | Status: DC

## 2017-10-22 NOTE — Telephone Encounter (Signed)
Ok to rf? 

## 2017-10-22 NOTE — Telephone Encounter (Signed)
Patient called requesting an RX refill on his Oxycodone.  CB#[phone].  Thank you

## 2017-10-22 NOTE — Telephone Encounter (Signed)
IC LM advising could pick up rx at front desk 

## 2017-10-22 NOTE — Telephone Encounter (Signed)
Ok to rf 1 po q 8 3 30  PLS CLA HTX

## 2017-11-05 ENCOUNTER — Encounter (INDEPENDENT_AMBULATORY_CARE_PROVIDER_SITE_OTHER): Payer: Self-pay | Admitting: Orthopedic Surgery

## 2017-11-05 ENCOUNTER — Ambulatory Visit (INDEPENDENT_AMBULATORY_CARE_PROVIDER_SITE_OTHER): Payer: 59 | Admitting: Orthopedic Surgery

## 2017-11-05 DIAGNOSIS — S838X1D Sprain of other specified parts of right knee, subsequent encounter: Secondary | ICD-10-CM

## 2017-11-05 NOTE — Progress Notes (Signed)
   Post-Op Visit Note   Patient: Edward Delgado           Date of Birth: 04/13/1965           MRN: [id number] Visit Date: 11/05/2017 PCP: Patient, No Pcp Per   Assessment & Plan:  Chief Complaint:  Chief Complaint  Patient presents with  . Right Knee - Follow-up   Visit Diagnoses:  1. Injury of meniscus of right knee, subsequent encounter     Plan: Edward Delgado is a patient with right knee pain.  He is now about a month out right knee arthroscopy.  He is doing well but is making slow progress.  On examination he has good range of motion and moderate effusion.  He is off pain medicine as of yesterday.  He is going to take anti-inflammatories.  I will check him back in about 3 weeks and we may aspirate the knee at that time.  He does do tile work.  He will be ready for that until at least a month or 2.  Follow-Up Instructions: Return in about 3 weeks (around 11/26/2017).   Orders:  No orders of the defined types were placed in this encounter.  No orders of the defined types were placed in this encounter.   Imaging: No results found.  PMFS History: There are no active problems to display for this patient.  History reviewed. No pertinent past medical history.  History reviewed. No pertinent family history.  Past Surgical History:  Procedure Laterality Date  . HERNIA REPAIR     Social History   Occupational History  . Not on file  Tobacco Use  . Smoking status: Never Smoker  . Smokeless tobacco: Never Used  Substance and Sexual Activity  . Alcohol use: Yes    Comment: occ  . Drug use: No  . Sexual activity: Not on file

## 2017-11-06 ENCOUNTER — Telehealth (INDEPENDENT_AMBULATORY_CARE_PROVIDER_SITE_OTHER): Payer: Self-pay | Admitting: Orthopedic Surgery

## 2017-11-06 NOTE — Telephone Encounter (Signed)
Patient's (Partner) Myriam JacobsonHelen called stating the patient's employer [employer] need to know how long will the patient be out of work. The employer also need to know any and all restrictions for the patient. The claim # is [id number]  Attention Lorin PicketScott    At the benefits center   The fax# is [phone]    The number to contact patient is [phone]

## 2017-11-06 NOTE — Telephone Encounter (Signed)
Please advise, thanks.

## 2017-11-08 NOTE — Telephone Encounter (Signed)
Oow for 6 more weeks pls claal htx

## 2017-11-09 NOTE — Telephone Encounter (Signed)
Work note entered. I called patient and advised. Faxed per patient request.

## 2017-11-26 ENCOUNTER — Ambulatory Visit (INDEPENDENT_AMBULATORY_CARE_PROVIDER_SITE_OTHER): Payer: 59 | Admitting: Orthopedic Surgery

## 2017-12-13 ENCOUNTER — Ambulatory Visit (INDEPENDENT_AMBULATORY_CARE_PROVIDER_SITE_OTHER): Payer: 59 | Admitting: Orthopedic Surgery

## 2017-12-13 ENCOUNTER — Encounter (INDEPENDENT_AMBULATORY_CARE_PROVIDER_SITE_OTHER): Payer: Self-pay | Admitting: Orthopedic Surgery

## 2017-12-13 DIAGNOSIS — S838X1D Sprain of other specified parts of right knee, subsequent encounter: Secondary | ICD-10-CM

## 2017-12-14 ENCOUNTER — Encounter (INDEPENDENT_AMBULATORY_CARE_PROVIDER_SITE_OTHER): Payer: Self-pay | Admitting: Orthopedic Surgery

## 2017-12-14 NOTE — Progress Notes (Signed)
   Post-Op Visit Note   Patient: Edward Delgado           Date of Birth: [birth year]           MRN: [id number] Visit Date: 12/13/2017 PCP: Patient, No Pcp Per   Assessment & Plan:  Chief Complaint:  Chief Complaint  Patient presents with  . Right Knee - Follow-up   Visit Diagnoses:  1. Injury of meniscus of right knee, subsequent encounter     Plan: Edward Delgado is now about 8 weeks out right knee arthroscopy with partial medial meniscectomy.  Has some arthritis in the knee as well.  He does very physical type work.  He is having some swelling in the knee.  On examination today he does have a incised Baker's cyst.  Also has mild knee effusion.  Today we did a prone aspiration of that cyst with ultrasound guidance and also aspirated and injected the knee from a superior lateral approach.  We will see how that does for him.  I will see him back in about 4 weeks for clinical recheck.  He will need to be out of work during that time due to the physical nature of his work but I think he will be able to go back to work after that next clinic visit appointment.  Follow-Up Instructions: Return in about 4 weeks (around 01/10/2018).   Orders:  No orders of the defined types were placed in this encounter.  No orders of the defined types were placed in this encounter.   Imaging: No results found.  PMFS History: There are no active problems to display for this patient.  History reviewed. No pertinent past medical history.  History reviewed. No pertinent family history.  Past Surgical History:  Procedure Laterality Date  . HERNIA REPAIR     Social History   Occupational History  . Not on file  Tobacco Use  . Smoking status: Never Smoker  . Smokeless tobacco: Never Used  Substance and Sexual Activity  . Alcohol use: Yes    Comment: occ  . Drug use: No  . Sexual activity: Not on file

## 2017-12-27 ENCOUNTER — Telehealth (INDEPENDENT_AMBULATORY_CARE_PROVIDER_SITE_OTHER): Payer: Self-pay | Admitting: Orthopedic Surgery

## 2017-12-27 NOTE — Telephone Encounter (Signed)
[facility] call from [facility] (Caregiver) she advised she spoke with [employer] and was told the form have not been faxed to them. She asked if patient can be called when forms are faxed to [employer]. The number to contact Myriam JacobsonHelen is [phone]   The number to contact patient is [phone]

## 2017-12-27 NOTE — Telephone Encounter (Signed)
Yes, and I faxed the form to [employer] on the 17th. The fax said it went through. I'll refax it and call the patient.

## 2017-12-27 NOTE — Telephone Encounter (Signed)
Thanks so much for your help. 

## 2017-12-27 NOTE — Telephone Encounter (Signed)
I have not received any forms for this patient. Do you have them?

## 2018-01-07 ENCOUNTER — Telehealth (INDEPENDENT_AMBULATORY_CARE_PROVIDER_SITE_OTHER): Payer: Self-pay | Admitting: Orthopedic Surgery

## 2018-01-07 NOTE — Telephone Encounter (Signed)
Faxed to [employer] advised that there are no record within dates 02-08-2017 through 08-07-2017. [phone]

## 2018-01-11 ENCOUNTER — Ambulatory Visit (INDEPENDENT_AMBULATORY_CARE_PROVIDER_SITE_OTHER): Payer: 59 | Admitting: Orthopedic Surgery

## 2018-05-28 DIAGNOSIS — H2513 Age-related nuclear cataract, bilateral: Secondary | ICD-10-CM | POA: Diagnosis not present

## 2018-05-28 DIAGNOSIS — H401111 Primary open-angle glaucoma, right eye, mild stage: Secondary | ICD-10-CM | POA: Diagnosis not present

## 2018-05-28 DIAGNOSIS — H401123 Primary open-angle glaucoma, left eye, severe stage: Secondary | ICD-10-CM | POA: Diagnosis not present

## 2018-07-11 ENCOUNTER — Observation Stay (HOSPITAL_COMMUNITY)
Admission: EM | Admit: 2018-07-11 | Discharge: 2018-07-12 | Disposition: A | Discharge status: Home / Self Care | Payer: 59 | Attending: Family Medicine | Admitting: Family Medicine

## 2018-07-11 ENCOUNTER — Encounter (HOSPITAL_COMMUNITY): Payer: Self-pay | Admitting: Emergency Medicine

## 2018-07-11 ENCOUNTER — Emergency Department (HOSPITAL_COMMUNITY): Payer: 59

## 2018-07-11 DIAGNOSIS — Z79899 Other long term (current) drug therapy: Secondary | ICD-10-CM | POA: Diagnosis not present

## 2018-07-11 DIAGNOSIS — H409 Unspecified glaucoma: Secondary | ICD-10-CM | POA: Diagnosis not present

## 2018-07-11 DIAGNOSIS — R918 Other nonspecific abnormal finding of lung field: Secondary | ICD-10-CM | POA: Diagnosis not present

## 2018-07-11 DIAGNOSIS — K0889 Other specified disorders of teeth and supporting structures: Secondary | ICD-10-CM | POA: Insufficient documentation

## 2018-07-11 DIAGNOSIS — J984 Other disorders of lung: Secondary | ICD-10-CM | POA: Diagnosis not present

## 2018-07-11 DIAGNOSIS — Z791 Long term (current) use of non-steroidal anti-inflammatories (NSAID): Secondary | ICD-10-CM | POA: Diagnosis not present

## 2018-07-11 DIAGNOSIS — A150 Tuberculosis of lung: Secondary | ICD-10-CM | POA: Insufficient documentation

## 2018-07-11 DIAGNOSIS — K029 Dental caries, unspecified: Secondary | ICD-10-CM | POA: Insufficient documentation

## 2018-07-11 DIAGNOSIS — F1290 Cannabis use, unspecified, uncomplicated: Secondary | ICD-10-CM | POA: Diagnosis not present

## 2018-07-11 DIAGNOSIS — J851 Abscess of lung with pneumonia: Secondary | ICD-10-CM | POA: Diagnosis not present

## 2018-07-11 DIAGNOSIS — E876 Hypokalemia: Secondary | ICD-10-CM

## 2018-07-11 DIAGNOSIS — F1020 Alcohol dependence, uncomplicated: Secondary | ICD-10-CM | POA: Diagnosis not present

## 2018-07-11 DIAGNOSIS — J189 Pneumonia, unspecified organism: Secondary | ICD-10-CM | POA: Diagnosis not present

## 2018-07-11 DIAGNOSIS — R911 Solitary pulmonary nodule: Secondary | ICD-10-CM | POA: Diagnosis not present

## 2018-07-11 LAB — CBC WITH DIFFERENTIAL/PLATELET
Abs Immature Granulocytes: 0.1 10*3/uL — ABNORMAL HIGH (ref 0.00–0.07)
Basophils Absolute: 0 10*3/uL (ref 0.0–0.1)
Basophils Relative: 0 %
Eosinophils Absolute: 0 10*3/uL (ref 0.0–0.5)
Eosinophils Relative: 0 %
HCT: 41.9 % (ref 39.0–52.0)
Hemoglobin: 13.8 g/dL (ref 13.0–17.0)
Immature Granulocytes: 1 %
Lymphocytes Relative: 7 %
Lymphs Abs: 1.1 10*3/uL (ref 0.7–4.0)
MCH: 28.6 pg (ref 26.0–34.0)
MCHC: 32.9 g/dL (ref 30.0–36.0)
MCV: 86.7 fL (ref 80.0–100.0)
Monocytes Absolute: 1.8 10*3/uL — ABNORMAL HIGH (ref 0.1–1.0)
Monocytes Relative: 10 %
Neutro Abs: 14 10*3/uL — ABNORMAL HIGH (ref 1.7–7.7)
Neutrophils Relative %: 82 %
Platelets: 272 10*3/uL (ref 150–400)
RBC: 4.83 MIL/uL (ref 4.22–5.81)
RDW: 13.1 % (ref 11.5–15.5)
WBC: 17 10*3/uL — ABNORMAL HIGH (ref 4.0–10.5)
nRBC: 0 % (ref 0.0–0.2)

## 2018-07-11 LAB — D-DIMER, QUANTITATIVE (NOT AT ARMC): D-Dimer, Quant: 2.36 ug/mL-FEU — ABNORMAL HIGH (ref 0.00–0.50)

## 2018-07-11 LAB — BASIC METABOLIC PANEL
Anion gap: 10 (ref 5–15)
BUN: 8 mg/dL (ref 6–20)
CO2: 24 mmol/L (ref 22–32)
Calcium: 8.6 mg/dL — ABNORMAL LOW (ref 8.9–10.3)
Chloride: 101 mmol/L (ref 98–111)
Creatinine, Ser: 0.86 mg/dL (ref 0.61–1.24)
GFR calc Af Amer: >60 mL/min (ref >60)
GFR calc non Af Amer: >60 mL/min (ref >60)
Glucose, Bld: 126 mg/dL — ABNORMAL HIGH (ref 70–99)
Potassium: 3.3 mmol/L — ABNORMAL LOW (ref 3.5–5.1)
Sodium: 135 mmol/L (ref 135–145)

## 2018-07-11 LAB — PHOSPHORUS: Phosphorus: 2.5 mg/dL (ref 2.5–4.6)

## 2018-07-11 LAB — MRSA PCR SCREENING: MRSA by PCR: NEGATIVE

## 2018-07-11 LAB — HEMOGLOBIN A1C
Hgb A1c MFr Bld: 5.2 % (ref 4.8–5.6)
Mean Plasma Glucose: 102.54 mg/dL

## 2018-07-11 LAB — LACTIC ACID, PLASMA: Lactic Acid, Venous: 1.2 mmol/L (ref 0.5–1.9)

## 2018-07-11 LAB — ALBUMIN: Albumin: 2.9 g/dL — ABNORMAL LOW (ref 3.5–5.0)

## 2018-07-11 LAB — MAGNESIUM: Magnesium: 2.1 mg/dL (ref 1.7–2.4)

## 2018-07-11 MED ORDER — VANCOMYCIN HCL IN DEXTROSE 1-5 GM/200ML-% IV SOLN
1000.0000 mg | Freq: Two times a day (BID) | INTRAVENOUS | Status: DC
  Filled 2018-07-11 (×2): qty 200

## 2018-07-11 MED ORDER — LATANOPROST 0.005 % OP SOLN
1.0000 [drp] | Freq: Every day | OPHTHALMIC | Status: DC
  Administered 2018-07-11: 1 [drp] via OPHTHALMIC
  Filled 2018-07-11 (×2): qty 2.5

## 2018-07-11 MED ORDER — IOPAMIDOL (ISOVUE-370) INJECTION 76%
INTRAVENOUS | Status: AC
  Filled 2018-07-11: qty 100

## 2018-07-11 MED ORDER — METRONIDAZOLE IN NACL 5-0.79 MG/ML-% IV SOLN
500.0000 mg | Freq: Three times a day (TID) | INTRAVENOUS | Status: DC
  Administered 2018-07-11: 500 mg via INTRAVENOUS
  Filled 2018-07-11: qty 100

## 2018-07-11 MED ORDER — MORPHINE SULFATE (PF) 4 MG/ML IV SOLN
4.0000 mg | Freq: Once | INTRAVENOUS | Status: AC
  Administered 2018-07-11: 4 mg via INTRAVENOUS
  Filled 2018-07-11: qty 1

## 2018-07-11 MED ORDER — ACETAMINOPHEN 325 MG PO TABS: 650.0000 mg | ORAL_TABLET | Freq: Four times a day (QID) | ORAL | Status: DC | PRN

## 2018-07-11 MED ORDER — METRONIDAZOLE IN NACL 5-0.79 MG/ML-% IV SOLN
500.0000 mg | Freq: Three times a day (TID) | INTRAVENOUS | Status: DC
  Administered 2018-07-11 – 2018-07-12 (×3): 500 mg via INTRAVENOUS
  Filled 2018-07-11 (×3): qty 100

## 2018-07-11 MED ORDER — ONDANSETRON HCL 4 MG/2ML IJ SOLN
4.0000 mg | Freq: Once | INTRAMUSCULAR | Status: AC
  Administered 2018-07-11: 05:00:00 4 mg via INTRAVENOUS
  Filled 2018-07-11: qty 2

## 2018-07-11 MED ORDER — VANCOMYCIN HCL 10 G IV SOLR
1500.0000 mg | INTRAVENOUS | Status: AC
  Administered 2018-07-11: 07:00:00 1500 mg via INTRAVENOUS
  Filled 2018-07-11: qty 1500

## 2018-07-11 MED ORDER — SODIUM CHLORIDE 0.9 % IV SOLN
2.0000 g | Freq: Two times a day (BID) | INTRAVENOUS | Status: DC
  Administered 2018-07-11 – 2018-07-12 (×2): 2 g via INTRAVENOUS
  Filled 2018-07-11 (×4): qty 2

## 2018-07-11 MED ORDER — SODIUM CHLORIDE 0.9 % IV SOLN
2.0000 g | INTRAVENOUS | Status: AC
  Administered 2018-07-11: 2 g via INTRAVENOUS
  Filled 2018-07-11: qty 2

## 2018-07-11 MED ORDER — TRAMADOL HCL 50 MG PO TABS
50.0000 mg | ORAL_TABLET | Freq: Four times a day (QID) | ORAL | Status: DC | PRN
  Administered 2018-07-11 – 2018-07-12 (×2): 50 mg via ORAL
  Filled 2018-07-11 (×2): qty 1

## 2018-07-11 MED ORDER — ENOXAPARIN SODIUM 40 MG/0.4ML ~~LOC~~ SOLN
40.0000 mg | SUBCUTANEOUS | Status: DC
  Administered 2018-07-11 – 2018-07-12 (×2): 40 mg via SUBCUTANEOUS
  Filled 2018-07-11 (×2): qty 0.4

## 2018-07-11 MED ORDER — ACETAMINOPHEN 650 MG RE SUPP: 650.0000 mg | Freq: Four times a day (QID) | RECTAL | Status: DC | PRN

## 2018-07-11 MED ORDER — SODIUM CHLORIDE 0.9 % IV BOLUS
1000.0000 mL | Freq: Once | INTRAVENOUS | Status: AC
  Administered 2018-07-11: 1000 mL via INTRAVENOUS

## 2018-07-11 MED ORDER — IOPAMIDOL (ISOVUE-370) INJECTION 76%
75.0000 mL | Freq: Once | INTRAVENOUS | Status: AC | PRN
  Administered 2018-07-11: 04:00:00 75 mL via INTRAVENOUS

## 2018-07-11 MED ORDER — POLYETHYLENE GLYCOL 3350 17 G PO PACK: 17.0000 g | PACK | Freq: Every day | ORAL | Status: DC | PRN

## 2018-07-11 MED ORDER — ALBUTEROL SULFATE (2.5 MG/3ML) 0.083% IN NEBU: 2.5000 mg | INHALATION_SOLUTION | Freq: Four times a day (QID) | RESPIRATORY_TRACT | Status: DC | PRN

## 2018-07-11 NOTE — ED Provider Notes (Signed)
[facility] EMERGENCY DEPARTMENT Provider Note   CSN: [id number] Arrival date & time: 07/11/18  0156    History   Chief Complaint Chief Complaint  Patient presents with  . Cough    HPI Edward Delgado is a 53 y.o. male.     53 year old male with no significant past medical history presents to the emergency department for evaluation of cough and congestion.  Reports onset of symptoms 5 days ago.  States that he has been coughing up phlegm at times.  Did notice some frank hemoptysis on 2 occasions.  Patient complaining of associated discomfort to his right lateral chest wall, nonradiating and worse with coughing.  He has taken ibuprofen for this without relief.  Has continued to work and notes 1 individual at his job who has been coughing.  Denies other sick contacts.  He has not had any recent travel.  No fevers, shortness of breath, syncope or near syncope, vomiting, leg swelling.  Denies use of tobacco products.  Drinks 12-14 beers per week.  No hx of TB.  He is not followed by a PCP.  The history is provided by the patient. No language interpreter was used.  Cough    History reviewed. No pertinent past medical history.  There are no active problems to display for this patient.   Past Surgical History:  Procedure Laterality Date  . HERNIA REPAIR          Home Medications    Prior to Admission medications   Medication Sig Start Date End Date Taking? Authorizing Provider  latanoprost (XALATAN) 0.005 % ophthalmic solution Place 1 drop into both eyes at bedtime.  07/11/17  Yes [provider]  HYDROcodone-acetaminophen (NORCO/VICODIN) 5-325 MG tablet Take 1 tablet by mouth every 4 (four) hours as needed for moderate pain or severe pain. Patient not taking: Reported on 12/13/2017 02/22/15   Trixie Dredge, PA-C  oxyCODONE (OXY IR/ROXICODONE) 5 MG immediate release tablet 1 po q 8hrs prn pain Patient not taking: Reported on 07/11/2018 10/22/17   Cammy Copa, MD    Family History No family history on file.  Social History Social History   Tobacco Use  . Smoking status: Never Smoker  . Smokeless tobacco: Never Used  Substance Use Topics  . Alcohol use: Yes    Comment: occ  . Drug use: No     Allergies   Patient has no known allergies.   Review of Systems Review of Systems  Respiratory: Positive for cough.   Ten systems reviewed and are negative for acute change, except as noted in the HPI.    Physical Exam Updated Vital Signs BP 136/88 (BP Location: Right Arm)   Pulse 100   Temp 99 F (37.2 C) (Oral)   Resp 20   Ht 5\' 6"  (1.676 m)   Wt 74.8 kg   SpO2 99%   BMI 26.63 kg/m   Physical Exam Vitals signs and nursing note reviewed.  Constitutional:      General: He is not in acute distress.    Appearance: He is well-developed. He is not diaphoretic.     Comments: Nontoxic appearing and in NAD  HENT:     Head: Normocephalic and atraumatic.  Eyes:     General: No scleral icterus.    Conjunctiva/sclera: Conjunctivae normal.  Neck:     Musculoskeletal: Normal range of motion.  Cardiovascular:     Rate and Rhythm: Regular rhythm. Tachycardia present.     Pulses: Normal  pulses.  Pulmonary:     Effort: Pulmonary effort is normal. No respiratory distress.     Breath sounds: No stridor. No wheezing, rhonchi or rales.     Comments: Lungs CTAB. Respirations even and unlabored. Chest:     Chest wall: Tenderness (at and slightly under lateral right pectoralis muscle) present.  Musculoskeletal: Normal range of motion.     Comments: No BLE edema.  Skin:    General: Skin is warm and dry.     Coloration: Skin is not pale.     Findings: No erythema or rash.  Neurological:     General: No focal deficit present.     Mental Status: He is alert and oriented to person, place, and time.     Coordination: Coordination normal.     Comments: Moving all extremities spontaneously.  Psychiatric:        Behavior:  Behavior normal.      ED Treatments / Results  Labs (all labs ordered are listed, but only abnormal results are displayed) Labs Reviewed  D-DIMER, QUANTITATIVE (NOT AT [facility]) - Abnormal; Notable for the following components:      Result Value   D-Dimer, Quant 2.36 (*)    All other components within normal limits  CBC WITH DIFFERENTIAL/PLATELET - Abnormal; Notable for the following components:   WBC 17.0 (*)    Neutro Abs 14.0 (*)    Monocytes Absolute 1.8 (*)    Abs Immature Granulocytes 0.10 (*)    All other components within normal limits  BASIC METABOLIC PANEL - Abnormal; Notable for the following components:   Potassium 3.3 (*)    Glucose, Bld 126 (*)    Calcium 8.6 (*)    All other components within normal limits  CULTURE, BLOOD (ROUTINE X 2)  CULTURE, BLOOD (ROUTINE X 2)  MRSA PCR SCREENING  LACTIC ACID, PLASMA    EKG None  Radiology Dg Chest 2 View  Result Date: 07/11/2018 CLINICAL DATA:  Chest pain for a week, cough for 3 days. EXAM: CHEST - 2 VIEW COMPARISON:  Chest radiograph September 19, 2009 FINDINGS: RIGHT mid lung zone consolidation with central lucency. No pleural effusion. Cardiac silhouette is normal. No pneumothorax. Soft tissue planes included osseous structures are unremarkable. IMPRESSION: RIGHT mid lung zone pneumonia with cavitation/necrosis, abscess possible. Followup PA and lateral chest X-ray is recommended in 3-4 weeks following trial of antibiotic therapy to ensure resolution and exclude underlying malignancy. Electronically Signed   By: Awilda Metro M.D.   On: 07/11/2018 03:07   Ct Angio Chest Pe W And/or Wo Contrast  Result Date: 07/11/2018 CLINICAL DATA:  Hemoptysis and congestion beginning 4 days ago EXAM: CT ANGIOGRAPHY CHEST WITH CONTRAST TECHNIQUE: Multidetector CT imaging of the chest was performed using the standard protocol during bolus administration of intravenous contrast. Multiplanar CT image reconstructions and MIPs were obtained to  evaluate the vascular anatomy. CONTRAST:  75mL ISOVUE-370 IOPAMIDOL (ISOVUE-370) INJECTION 76% COMPARISON:  Chest x-ray earlier today FINDINGS: Cardiovascular: Satisfactory opacification of the pulmonary arteries to the segmental level. No evidence of pulmonary embolism. Normal heart size. No pericardial effusion. Mediastinum/Nodes: Mild prominence of right hilar lymph nodes considered reactive in this setting. These nodes show no visible cavitation Lungs/Pleura: Consolidation in the right middle lobe with central cavitation and fluid level. The area of consolidation measures up to 9 cm in the area of cavitation up to 3.6 cm. Trace pleural fluid on the right. No calcified or other pulmonary nodules to imply cavitating metastatic disease, prior granulomatous  infection, or noninfective granuloma. Upper Abdomen: Negative Musculoskeletal: No acute finding. These results were called by telephone at the time of interpretation on 07/11/2018 at 4:18 am to Dr. Antony Madura , who verbally acknowledged these results. Review of the MIP images confirms the above findings. IMPRESSION: 1. Right middle lobe thick walled cavity with fluid level and regional consolidation. Primary differential considerations are necrotizing pneumonia, tuberculosis, and less likely malignancy. Recommend imaging follow-up to normalization. 2. Negative for pulmonary embolism. Electronically Signed   By: Marnee Spring M.D.   On: 07/11/2018 04:18    Procedures .Critical Care Performed by: Antony Madura, PA-C Authorized by: Antony Madura, PA-C   Critical care provider statement:    Critical care time (minutes):  45   Critical care was time spent personally by me on the following activities:  Discussions with consultants, evaluation of patient's response to treatment, examination of patient, ordering and performing treatments and interventions, ordering and review of laboratory studies, ordering and review of radiographic studies, pulse oximetry,  re-evaluation of patient's condition, obtaining history from patient or surrogate and review of old charts   (including critical care time)  Medications Ordered in ED Medications  ceFEPIme (MAXIPIME) 2 g in sodium chloride 0.9 % 100 mL IVPB (2 g Intravenous New Bag/Given 07/11/18 0514)  vancomycin (VANCOCIN) 1,500 mg in sodium chloride 0.9 % 500 mL IVPB (has no administration in time range)  ceFEPIme (MAXIPIME) 2 g in sodium chloride 0.9 % 100 mL IVPB (has no administration in time range)  vancomycin (VANCOCIN) IVPB 1000 mg/200 mL premix (has no administration in time range)  sodium chloride 0.9 % bolus 1,000 mL (1,000 mLs Intravenous New Bag/Given 07/11/18 0513)  iopamidol (ISOVUE-370) 76 % injection 75 mL (75 mLs Intravenous Contrast Given 07/11/18 0356)  morphine 4 MG/ML injection 4 mg (4 mg Intravenous Given 07/11/18 0514)  ondansetron (ZOFRAN) injection 4 mg (4 mg Intravenous Given 07/11/18 0514)    4:37 AM Case discussed with FM residency service who will see the patient in the ED for evaluation.   Initial Impression / Assessment and Plan / ED Course  I have reviewed the triage vital signs and the nursing notes.  Pertinent labs & imaging results that were available during my care of the patient were reviewed by me and considered in my medical decision making (see chart for details).        53 year old male presenting to the emergency department for a cough productive of phlegm with intermittent hemoptysis.  Symptoms began on Sunday.  Reports 1 sick contact at work.  No fevers prior to arrival known to patient.  He is afebrile in the ED without hypoxia.  Complaining of right-sided pleuritic discomfort.  Chest x-ray and subsequent CT reveal right middle lobe cavitary lesion with fluid level and regional consolidation.  Findings concerning for necrotizing pneumonia, though tuberculosis also considered.  Patient does not have any specific risk factors for TB.  He was started on vancomycin and  cefepime in the emergency department given potential for MRSA pneumonia.  He will be admitted for ongoing antibiotics and monitoring.   Final Clinical Impressions(s) / ED Diagnoses   Final diagnoses:  Cavitary pneumonia  Hypokalemia    ED Discharge Orders    None       Antony Madura, PA-C 07/11/18 0524    Dione Booze, MD 07/11/18 [phone]

## 2018-07-11 NOTE — ED Triage Notes (Signed)
Pt reports cough/congestion since Sunday

## 2018-07-11 NOTE — ED Notes (Signed)
Bed reassigned from 5W to 6N d/t need for neg pressure room

## 2018-07-11 NOTE — TOC Initial Note (Signed)
Transition of Care [facility]) - Initial/Assessment Note    Patient Details  Name: Edward Delgado MRN: [id number] Date of Birth: Jun 24, 1965  Transition of Care [facility]) CM/SW Contact:    Kingsley Plan, RN Phone Number: 07/11/2018, 10:57 AM  Clinical Narrative:                  Patient from home with wife. Has insurance and is able to get to follow up appointments.   Patient does not not have a PCP. Explained to patient he can call number on insurance card and be provided a full list of MD's in network and pick from there. Patient also interested in [facility]. Will place information on discharge paperwork.  Also will check with Family Medicine to see they will follow in clinic. Expected Discharge Plan: Home/Self Care Barriers to Discharge: Continued Medical Work up   Patient Goals and CMS Choice        Expected Discharge Plan and Services Expected Discharge Plan: Home/Self Care       Living arrangements for the past 2 months: Single Family Home                          Prior Living Arrangements/Services Living arrangements for the past 2 months: Single Family Home Lives with:: Spouse Patient language and need for interpreter reviewed:: No Do you feel safe going back to the place where you live?: Yes      Need for Family Participation in Patient Care: No (Comment) Care giver support system in place?: No (comment)   Criminal Activity/Legal Involvement Pertinent to Current Situation/Hospitalization: Yes - Comment as needed  Activities of Daily Living      Permission Sought/Granted                  Emotional Assessment Appearance:: Appears stated age Attitude/Demeanor/Rapport: Engaged Affect (typically observed): Accepting Orientation: : Oriented to Situation, Oriented to Self, Oriented to Place, Oriented to  Time   Psych Involvement: No (comment)  Admission diagnosis:  Hypokalemia [E87.6] Cavitary pneumonia [J18.9, J98.4] Patient  Active Problem List   Diagnosis Date Noted  . Pneumonia with cavity of lung 07/11/2018   PCP:  Patient, No Pcp Per Pharmacy:   [id number] - [place], [place] - [address] AT [facility] [address] [place] [address] Phone: [phone] Fax: [phone]     Social Determinants of Health (SDOH) Interventions    Readmission Risk Interventions No flowsheet data found.

## 2018-07-11 NOTE — Discharge Summary (Signed)
[facility] Discharge Summary  Patient name: Edward Delgado Medical record number: [id number] Date of birth: 24-Mar-1966 Age: 53 y.o. Gender: male Date of Admission: 07/11/2018  Date of Discharge: 07/12/2018 Admitting Physician: Tobey Grim, MD  Primary Care Provider: Patient, No Pcp Per Consultants: ID  Indication for Hospitalization: cavitary Pneumonia  Discharge Diagnoses/Problem List:  RML pneumonia with cavitation Glaucoma Hypokalemia  Disposition: home  Discharge Condition: improved, stable  Discharge Exam:  BP (!) 146/85 (BP Location: Left Arm)   Pulse 90   Temp 98.8 F (37.1 C) (Oral)   Resp 18   Ht 5\' 7"  (1.702 m)   Wt 71.4 kg   SpO2 98%   BMI 24.65 kg/m   Gen: NAD, alert, non-toxic, well-nourished, well-appearing, sitting comfortably  CV: Regular rate and rhythm. Radial pulses 2+ bilaterally. No bilateral lower extremity edema. Resp: right side normal lung sounds.  Left side has decreased air movement, no crackles or wheezing. No increased work of breathing appreciated. On room air.  Abd: Nontender and nondistended on palpation to all 4 quadrants.  Positive bowel sounds. Psych: Cooperative with exam. Pleasant. Makes eye contact. Extremities: Full ROM    Brief Hospital Course:  53 year old male with known glaucoma and THC use with several day history of increasing right-sided chest pain and cough along with night sweats.  Patient with elevated d-dimer and CTA negative for PE that showed right-sided cavitary lesion. Quantiferon gold and acid fast culture were ordered but not resulted at the time of discharge Patient was started on cefepime and vanc on 4/2.  Vanc was stopped that day due to negative MRSA PCR and replaced with metronidazole.  On 4/3, per ID recommendation, antibiotics were switched to augmentin and patient was able to be discharged with two months of antibiotics and follow up with Dr. Daiva Eves.    Issues for Follow Up:   1. Abx: 2 months of augmentin.  followup with Dr. Daiva Eves on may 6th.  Acid fast and quantiferon pending. Patient will need new antibiotic regimen if it is Tuberculosis 2. Patient is to stay home and self isolate until TB results are back.   Significant Procedures: none  Significant Labs and Imaging:  Recent Labs  Lab 07/11/18 0228 07/12/18 0236  WBC 17.0* 11.5*  HGB 13.8 12.9*  HCT 41.9 38.9*  PLT 272 292   Recent Labs  Lab 07/11/18 0228 07/11/18 0619 07/11/18 1005 07/12/18 0236  NA 135  --   --  137  K 3.3*  --   --  3.2*  CL 101  --   --  103  CO2 24  --   --  26  GLUCOSE 126*  --   --  115*  BUN 8  --   --  <5*  CREATININE 0.86  --   --  0.87  CALCIUM 8.6*  --   --  8.7*  MG  --   --  2.1  --   PHOS  --   --  2.5  --   ALBUMIN  --  2.9*  --   --    DG Chest XRay 07/11/2018 IMPRESSION: RIGHT mid lung zone pneumonia with cavitation/necrosis, abscess possible. Followup PA and lateral chest X-ray is recommended in 3-4 weeks following trial of antibiotic therapy to ensure resolution and exclude underlying malignancy.  CTA Chest Pe W And/or Wo Contrast IMPRESSION: 1. Right middle lobe thick walled cavity with fluid level and regional consolidation. Primary differential considerations are  necrotizing pneumonia, tuberculosis, and less likely malignancy. Recommend imaging follow-up to normalization. 2. Negative for pulmonary embolism.   Results/Tests Pending at Time of Discharge: Quantiferon Gold; acid fast culture, blood culture.   Discharge Medications:  Allergies as of 07/12/2018   No Known Allergies     Medication List    STOP taking these medications   HYDROcodone-acetaminophen 5-325 MG tablet Commonly known as:  NORCO/VICODIN   oxyCODONE 5 MG immediate release tablet Commonly known as:  Oxy IR/ROXICODONE     TAKE these medications   amoxicillin-clavulanate 875-125 MG tablet Commonly known as:  AUGMENTIN Take 1 tablet by mouth 2 (two) times daily.    latanoprost 0.005 % ophthalmic solution Commonly known as:  XALATAN Place 1 drop into both eyes at bedtime.       Discharge Instructions: Please refer to Patient Instructions section of EMR for full details.  Patient was counseled important signs and symptoms that should prompt return to medical care, changes in medications, dietary instructions, activity restrictions, and follow up appointments.   Follow-Up Appointments: Follow-up Information    [facility]. Schedule an appointment as soon as possible for a visit.   Contact information: [address] [place] [address] [phone]       Daiva Eves, Lisette Grinder, MD. Go on 08/14/2018.   Specialty:  Infectious Diseases Contact information: [address] [place] [address] [phone]           Sandre Kitty, MD 07/12/2018, 3:56 PM PGY-1, [facility]

## 2018-07-11 NOTE — Progress Notes (Signed)
Pharmacy Antibiotic Note  Edward Delgado is a 53 y.o. male admitted on 07/11/2018 with pneumonia.  Pharmacy has been consulted for Vancomycin and Cefepime dosing.  Plan: Cefepime 2gm IV q12h Vancomycin 1500 mg IV now then 1000 mg IV Q 12 hrs. Goal AUC 400-550. Expected AUC: 530 SCr used: 1 Will f/u renal function, micro data, and pt's clinical condition Vanc levels prn   Height: 5\' 6"  (167.6 cm) Weight: 165 lb (74.8 kg) IBW/kg (Calculated) : 63.8  Temp (24hrs), Avg:99 F (37.2 C), Min:99 F (37.2 C), Max:99 F (37.2 C)  Recent Labs  Lab 07/11/18 0228  WBC 17.0*  CREATININE 0.86    Estimated Creatinine Clearance: 91.7 mL/min (by C-G formula based on SCr of 0.86 mg/dL).    No Known Allergies  Antimicrobials this admission: 4/2 Vanc >>  4/2 Cefepime >>    Microbiology results: Pending  Thank you for allowing pharmacy to be a part of this patient's care.  Christoper Fabian, PharmD, BCPS Clinical pharmacist  **Pharmacist phone directory can now be found on [email or web address] ([email or web address]).  Listed under [facility]. 07/11/2018 4:40 AM

## 2018-07-11 NOTE — H&P (Addendum)
Family Medicine Teaching [facility] Admission History and Physical Service Pager: [phone]  Patient name: Edward Delgado Medical record number: [id number] Date of birth: 1966-02-14 Age: 53 y.o. Gender: male  Primary Care Provider: Patient, No Pcp Per Consultants: none Code Status: full  Chief Complaint: Productive cough, right sided chest pain   Assessment and Plan: Edward Delgado is a 53 y.o. male presenting with productive cough and right sided chest pain starting acutely x3 days and found to have a cavitary lesion in the right middle lobe on CXR concerning for TB vs necrotizing PNA vs malignancy. PMH is significant for glaucoma.  R middle lobe pneumonia with cavitation- no history of lung disease. Smokes marijuana occasionally, but no tobacco. Chest xray shows cavitation/necrosis on mid R lung zone. CTA negative for PE- showed R middle lobe thick walled cavity with fluid level and regional consolidation. Pertinent labs showed WBC count of 17, d-dimer 2.36, LA normal. Blood cultures were collected and are pending. Patient is afebrile and stable on room air in ED with mild tachycardia and hypertension but appears comfortable without difficulty breathing. He received morphine, zofran, cefepime, vancomycin and IVF in ED. Etiology likely infectious- community acquired pneumonia/empyema/abscess. Less likely given low risk factors- TB (patient has been in contact with individual who appear to be homeless). Given black sputum concerning for blood would also consider bronchiectasis though PMH is not suggestive of that. Lower on differential is malignancy. Given cavitary lesion should also consider aspiration pneumonia.  - admit to med surg, attending Dr. Gwendolyn Grant - airborne precautions Medications:  - cefepime 2g - vancomycin given in ED --Discuss Anaerobic coverage (aspiration?) --Acetaminophen 650 mg q6 prn --s/p morphine  - tramadol q6hrs prn - miralax prn Labs: - quant  gold --Sputum x3 for AFB smear, mycobacteria cx and NAA testing - CBC, BMP am - f/u blood cultures - monitor kidney function Other: - recommend repeat chest xray after antibiotic treatment - vitals per floor protocol - oxygen therapy PRN  Hypokalemia, mild- K+ 3.3 on admission.  - replace PRN --Follow up on magnesium, Phos and Albumin  Hypocalcemia- Ca 8.6 on admission.Unclear Etiology for hypocalcemia. Given lung findings should consider malignancy. Patient is not taking any medications and does not have any surgical history. If albumin and phos are within normal limit would check PTH. - replace PRN  Glaucoma- chronic 5-6 years. Uses latanoprost eye drops at home and smokes occasional marijuana - continue home eye drops  FEN/GI: PIV, regular diet, airborne precautions Prophylaxis: lovenox  Disposition: admit to med surg  History of Present Illness:  Edward Delgado is a 53 y.o. male presenting with acute onset right-sided anterior chest pain that radiates to the Right side and back. Pain began when patient was lifting heavy steel beam at work on Sunday. He also developed a cough on Monday which was productive of blood and black phlegm. Prior to onset, patient denies any abnormal symptoms. His chest pain is worse with coughing and R arm movement. He has tried OTC advil and ibuprofen without relief. He thought that he had strained a muscle. He denies any shortness of breath or difficulty breathing. He has not had a fever, night sweats, weight loss. Endorses decreased appetite since Monday and continued black phlegm production. Currently has lightheadedness. Denies GI symptoms. Patient did not cough during encounter. He denies any travel the past few months. He lives at home with wife who is not sick. He has a sick contact at work who he has worked closely  with for the past month- patient describes this coworker as living out of car and has been sick and is un-hygienic. [place] also [place]. He denies history of imprisonment or any contacts that have been in prison or group living situations. No recent hospitalizations.  In the ED, Patient had a CTA to rule out PE given elevated D-Dimer 2.36 and respiratory symptoms. CXR showed RML cavitary lesion which was also seen on CTA. Patient was initially tachycardic and tachypenic however BP was elevated at 171/88. Patient also had leukocytosis WBC with a neutrophilic shift most consistent with infectious process but was afebrile. Patient was started Vanc and cefepime. Patient maintained good oxygen saturation while in the ED and did not require supplemental oxygen.    Review Of Systems: Per HPI with the following additions:   Review of Systems  Constitutional: Positive for chills. Negative for diaphoresis, fever, malaise/fatigue and weight loss.  HENT: Negative for sore throat.   Eyes: Negative.   Respiratory: Positive for cough, hemoptysis and sputum production. Negative for shortness of breath and wheezing.   Cardiovascular: Positive for chest pain. Negative for palpitations, orthopnea and leg swelling.  Gastrointestinal: Negative for abdominal pain, constipation, diarrhea, nausea and vomiting.  Genitourinary: Negative.   Skin: Negative for rash.  Neurological: Negative for dizziness, loss of consciousness and headaches.   Past Medical History: Glaucoma R knee meniscal tear  Past Surgical History: Past Surgical History:  Procedure Laterality Date  . 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  Family History: Non-contributory   Allergies and Medications: No Known Allergies   Medications: Latanoprost eye drops OTC: advil, ibuprofen   Objective: BP 136/88 (BP Location: Right Arm)   Pulse 100   Temp 99 F (37.2 C)  (Oral)   Resp 20   Ht  (1.676 m)   Wt 74.8 kg   SpO2 99%   BMI 26.63 kg/m  Exam: General: well-appearing, no acute distress, cooperative Eyes: clear conjunctiva, PERRLA ENTM: clear nares, no edema or drainage. Oro-pharynx negative for erythema/edema/exudates Neck: no cervical or periclavicular lymphadenitis and negative to pain to palpation Cardiovascular: RRR, no murmur appreciated Respiratory: CTAB, good patient participation showed good airflow to bilateral bases and no difficulty with breathing- no coughing illicited  Gastrointestinal: active bowel sounds diffusely MSK: tenderness to palpation over area from sternum to the right pectoralis to latissimus. No decreased ROM of R arm  Derm: no rashes noted at site of discomfort Neuro: alert and oriented, no focal deficits Psych: normal mood and affect  Labs and Imaging: CBC BMET  Recent Labs  Lab 07/11/18 0228  WBC 17.0*  HGB 13.8  HCT 41.9  PLT 272   Recent Labs  Lab 07/11/18 0228  NA 135  K 3.3*  CL 101  CO2 24  BUN 8  CREATININE 0.86  GLUCOSE 126*  CALCIUM 8.6*     D-dimer 2.36 LA 1.2 Blood Cx pending  Dg Chest 2 View  Result Date: 07/11/2018 CLINICAL DATA:  Chest pain for a week, cough for 3 days. EXAM: CHEST - 2 VIEW COMPARISON:  Chest radiograph September 19, 2009 FINDINGS: RIGHT mid lung zone consolidation with central lucency. No pleural effusion. Cardiac silhouette is normal. No pneumothorax. Soft tissue planes included osseous structures are unremarkable. IMPRESSION: RIGHT mid lung zone  pneumonia with cavitation/necrosis, abscess possible. Followup PA and lateral chest X-ray is recommended in 3-4 weeks following trial of antibiotic therapy to ensure resolution and exclude underlying malignancy. Electronically Signed   By: Awilda Metro M.D.   On: 07/11/2018 03:07   Ct Angio Chest Pe W And/or Wo Contrast  Result Date: 07/11/2018 CLINICAL DATA:  Hemoptysis and congestion beginning 4 days ago EXAM: CT  ANGIOGRAPHY CHEST WITH CONTRAST TECHNIQUE: Multidetector CT imaging of the chest was performed using the standard protocol during bolus administration of intravenous contrast. Multiplanar CT image reconstructions and MIPs were obtained to evaluate the vascular anatomy. CONTRAST:  47mL ISOVUE-370 IOPAMIDOL (ISOVUE-370) INJECTION 76% COMPARISON:  Chest x-ray earlier today FINDINGS: Cardiovascular: Satisfactory opacification of the pulmonary arteries to the segmental level. No evidence of pulmonary embolism. Normal heart size. No pericardial effusion. Mediastinum/Nodes: Mild prominence of right hilar lymph nodes considered reactive in this setting. These nodes show no visible cavitation Lungs/Pleura: Consolidation in the right middle lobe with central cavitation and fluid level. The area of consolidation measures up to 9 cm in the area of cavitation up to 3.6 cm. Trace pleural fluid on the right. No calcified or other pulmonary nodules to imply cavitating metastatic disease, prior granulomatous infection, or noninfective granuloma. Upper Abdomen: Negative Musculoskeletal: No acute finding. These results were called by telephone at the time of interpretation on 07/11/2018 at 4:18 am to Dr. Antony Madura , who verbally acknowledged these results. Review of the MIP images confirms the above findings. IMPRESSION: 1. Right middle lobe thick walled cavity with fluid level and regional consolidation. Primary differential considerations are necrotizing pneumonia, tuberculosis, and less likely malignancy. Recommend imaging follow-up to normalization. 2. Negative for pulmonary embolism. Electronically Signed   By: Marnee Spring M.D.   On: 07/11/2018 04:18    I have seen and evaluated the patient with Dr. Dareen Piano. I am in agreement with the note above in its revised form. My additions are in blue.  Lovena Neighbours, MD Family Medicine, PGY-3  Leeroy Bock, DO 07/11/2018, 4:43 AM PGY-1, [facility] Family  Medicine FPTS Intern pager: [phone], text pages welcome

## 2018-07-11 NOTE — ED Notes (Signed)
ED TO INPATIENT HANDOFF REPORT  ED Nurse Name and Phone #: Delorise Jackson [phone]  S Name/Age/Gender Edward Delgado 53 y.o. male Room/Bed: 032C/032C  Code Status   Code Status: Not on file  Home/SNF/Other Home Patient oriented to: self Is this baseline? Yes   Triage Complete: Triage complete  Chief Complaint coughing vomiting   Triage Note Pt reports cough/congestion since Sunday   Allergies No Known Allergies  Level of Care/Admitting Diagnosis ED Disposition    ED Disposition Condition Comment   Admit  Hospital Area: [facility] [[id number]]  Level of Care: Med-Surg [16]  Diagnosis: Pneumonia with cavity of lung [[id number]]  Admitting Physician: Leeroy Bock [[id number]]  Attending Physician: Gwendolyn Grant, JEFFREY H [[id number]]  Estimated length of stay: past midnight tomorrow  Certification:: I certify this patient will need inpatient services for at least 2 midnights  PT Class (Do Not Modify): Inpatient [101]  PT Acc Code (Do Not Modify): Private [1]       B Medical/Surgery History History reviewed. No pertinent past medical history. Past Surgical History:  Procedure Laterality Date  . HERNIA REPAIR       A IV Location/Drains/Wounds Patient Lines/Drains/Airways Status   Active Line/Drains/Airways    Name:   Placement date:   Placement time:   Site:   Days:   Peripheral IV 07/11/18 Right Antecubital   07/11/18    0305    Antecubital   less than 1          Intake/Output Last 24 hours No intake or output data in the 24 hours ending 07/11/18 7414  Labs/Imaging Results for orders placed or performed during the hospital encounter of 07/11/18 (from the past 48 hour(s))  D-dimer, quantitative (not at [facility])     Status: Abnormal   Collection Time: 07/11/18  2:28 AM  Result Value Ref Range   D-Dimer, Quant 2.36 (H) 0.00 - 0.50 ug/mL-FEU    Comment: (NOTE) At the manufacturer cut-off of 0.50 ug/mL FEU, this assay has been documented to exclude PE with a  sensitivity and negative predictive value of 97 to 99%.  At this time, this assay has not been approved by the FDA to exclude DVT/VTE. Results should be correlated with clinical presentation. Performed at [facility], [address], [place], [place] [address]   CBC with Differential     Status: Abnormal   Collection Time: 07/11/18  2:28 AM  Result Value Ref Range   WBC 17.0 (H) 4.0 - 10.5 K/uL   RBC 4.83 4.22 - 5.81 MIL/uL   Hemoglobin 13.8 13.0 - 17.0 g/dL   HCT 20.2 33.4 - 35.6 %   MCV 86.7 80.0 - 100.0 fL   MCH 28.6 26.0 - 34.0 pg   MCHC 32.9 30.0 - 36.0 g/dL   RDW 86.1 68.3 - 72.9 %   Platelets 272 150 - 400 K/uL   nRBC 0.0 0.0 - 0.2 %   Neutrophils Relative % 82 %   Neutro Abs 14.0 (H) 1.7 - 7.7 K/uL   Lymphocytes Relative 7 %   Lymphs Abs 1.1 0.7 - 4.0 K/uL   Monocytes Relative 10 %   Monocytes Absolute 1.8 (H) 0.1 - 1.0 K/uL   Eosinophils Relative 0 %   Eosinophils Absolute 0.0 0.0 - 0.5 K/uL   Basophils Relative 0 %   Basophils Absolute 0.0 0.0 - 0.1 K/uL   Immature Granulocytes 1 %   Abs Immature Granulocytes 0.10 (H) 0.00 - 0.07 K/uL  Comment: Performed at [facility], [address] [place], [place] [address]  Basic metabolic panel     Status: Abnormal   Collection Time: 07/11/18  2:28 AM  Result Value Ref Range   Sodium 135 135 - 145 mmol/L   Potassium 3.3 (L) 3.5 - 5.1 mmol/L   Chloride 101 98 - 111 mmol/L   CO2 24 22 - 32 mmol/L   Glucose, Bld 126 (H) 70 - 99 mg/dL   BUN 8 6 - 20 mg/dL   Creatinine, Ser 6.04 0.61 - 1.24 mg/dL   Calcium 8.6 (L) 8.9 - 10.3 mg/dL   GFR calc non Af Amer >60 >60 mL/min   GFR calc Af Amer >60 >60 mL/min   Anion gap 10 5 - 15    Comment: Performed at [facility], [address], [place], [place] [address]  Lactic acid, plasma     Status: None   Collection Time: 07/11/18  4:34 AM  Result Value Ref Range   Lactic Acid, Venous 1.2 0.5 - 1.9 mmol/L    Comment: Performed at [facility], [address], [place], [place] [address]   Dg Chest 2 View  Result Date: 07/11/2018 CLINICAL DATA:  Chest pain for a week, cough for 3 days. EXAM: CHEST - 2 VIEW COMPARISON:  Chest radiograph September 19, 2009 FINDINGS: RIGHT mid lung zone consolidation with central lucency. No pleural effusion. Cardiac silhouette is normal. No pneumothorax. Soft tissue planes included osseous structures are unremarkable. IMPRESSION: RIGHT mid lung zone pneumonia with cavitation/necrosis, abscess possible. Followup PA and lateral chest X-ray is recommended in 3-4 weeks following trial of antibiotic therapy to ensure resolution and exclude underlying malignancy. Electronically Signed   By: Awilda Metro M.D.   On: 07/11/2018 03:07   Ct Angio Chest Pe W And/or Wo Contrast  Result Date: 07/11/2018 CLINICAL DATA:  Hemoptysis and congestion beginning 4 days ago EXAM: CT ANGIOGRAPHY CHEST WITH CONTRAST TECHNIQUE: Multidetector CT imaging of the chest was performed using the standard protocol during bolus administration of intravenous contrast. Multiplanar CT image reconstructions and MIPs were obtained to evaluate the vascular anatomy. CONTRAST:  75mL ISOVUE-370 IOPAMIDOL (ISOVUE-370) INJECTION 76% COMPARISON:  Chest x-ray earlier today FINDINGS: Cardiovascular: Satisfactory opacification of the pulmonary arteries to the segmental level. No evidence of pulmonary embolism. Normal heart size. No pericardial effusion. Mediastinum/Nodes: Mild prominence of right hilar lymph nodes considered reactive in this setting. These nodes show no visible cavitation Lungs/Pleura: Consolidation in the right middle lobe with central cavitation and fluid level. The area of consolidation measures up to 9 cm in the area of cavitation up to 3.6 cm. Trace pleural fluid on the right. No calcified or other pulmonary nodules to imply cavitating metastatic disease, prior granulomatous infection, or noninfective granuloma. Upper Abdomen: Negative Musculoskeletal: No  acute finding. These results were called by telephone at the time of interpretation on 07/11/2018 at 4:18 am to Dr. Antony Madura , who verbally acknowledged these results. Review of the MIP images confirms the above findings. IMPRESSION: 1. Right middle lobe thick walled cavity with fluid level and regional consolidation. Primary differential considerations are necrotizing pneumonia, tuberculosis, and less likely malignancy. Recommend imaging follow-up to normalization. 2. Negative for pulmonary embolism. Electronically Signed   By: Marnee Spring M.D.   On: 07/11/2018 04:18    Pending Labs Unresulted Labs (From admission, onward)    Start     Ordered   07/11/18 0549  Acid Fast Smear (AFB)  (  AFB smear + Culture w reflexed sensitivities panel)  Now then every 8 hours,   R    Question:  Patient immune status  Answer:  Normal   07/11/18 0548   07/11/18 0549  MTB NAA Without AFB Culture  Once,   R     07/11/18 0552   07/11/18 0546  Acid Fast Culture with reflexed sensitivities  (AFB smear + Culture w reflexed sensitivities panel)  Once,   R    Question:  Patient immune status  Answer:  Normal   07/11/18 0548   07/11/18 0541  QuantiFERON-TB Gold Plus  Once,   R     07/11/18 0540   07/11/18 0422  MRSA PCR Screening  ONCE - STAT,   R    Question:  Patient immune status  Answer:  Normal   07/11/18 0421   07/11/18 0419  Culture, blood (Routine X 2) w Reflex to ID Panel  BLOOD CULTURE X 2,   STAT     07/11/18 0418   Signed and Held  HIV antibody (Routine Testing)  Once,   R     Signed and Held   Signed and Held  CBC  (enoxaparin (LOVENOX)    CrCl >/= 30 ml/min)  Once,   R    Comments:  Baseline for enoxaparin therapy IF NOT ALREADY DRAWN.  Notify MD if PLT < 100 K.    Signed and Held   Signed and Held  Creatinine, serum  (enoxaparin (LOVENOX)    CrCl >/= 30 ml/min)  Once,   R    Comments:  Baseline for enoxaparin therapy IF NOT ALREADY DRAWN.    Signed and Held   Signed and Held  Creatinine, serum   (enoxaparin (LOVENOX)    CrCl >/= 30 ml/min)  Weekly,   R    Comments:  while on enoxaparin therapy    Signed and Held   Signed and Held  Magnesium  Once,   R     Signed and Held   Signed and Held  Phosphorus  Once,   R     Signed and Held   Signed and Held  Hemoglobin A1c  Once,   R     Signed and Held   Signed and Armed forces training and education officer morning,   R     Signed and Held   Signed and Held  CBC  Tomorrow morning,   R     Signed and Held          Vitals/Pain Today's Vitals   07/11/18 0330 07/11/18 0433 07/11/18 0433 07/11/18 0530  BP: 136/80  136/88 (!) 145/84  Pulse: (!) 103  100 98  Resp:   20   Temp:      TempSrc:   Oral   SpO2: 98%  99% 98%  Weight:  74.8 kg    Height:  5\' 6"  (1.676 m)    PainSc:        Isolation Precautions Airborne precautions  Medications Medications  vancomycin (VANCOCIN) 1,500 mg in sodium chloride 0.9 % 500 mL IVPB (has no administration in time range)  ceFEPIme (MAXIPIME) 2 g in sodium chloride 0.9 % 100 mL IVPB (has no administration in time range)  vancomycin (VANCOCIN) IVPB 1000 mg/200 mL premix (has no administration in time range)  sodium chloride 0.9 % bolus 1,000 mL (1,000 mLs Intravenous New Bag/Given 07/11/18 0513)  iopamidol (ISOVUE-370) 76 % injection 75 mL (75 mLs Intravenous Contrast Given 07/11/18 0356)  morphine 4 MG/ML injection 4 mg (4 mg Intravenous Given 07/11/18 0514)  ondansetron (ZOFRAN) injection 4 mg (4 mg Intravenous Given 07/11/18 0514)  ceFEPIme (MAXIPIME) 2 g in sodium chloride 0.9 % 100 mL IVPB (0 g Intravenous Stopped 07/11/18 0550)    Mobility walks Low fall risk   Focused Assessments Pulmonary Assessment Handoff:  Lung sounds: Bilateral Breath Sounds: Clear L Breath Sounds: Clear R Breath Sounds: Clear O2 Device: Room Air        R Recommendations: See Admitting Provider Note  Report given to:   Additional Notes: PNA vs TB r/o. Ambulatory.

## 2018-07-11 NOTE — Progress Notes (Signed)
FPTS Interim Progress Note  S:Patient reports that he is feeling ok since arriving. Not endorsing any SOB. Reports some pain with cough. Patient reports that he has had 2 weeks of night sweats and chills periodically. Reports being sexually active with females but not recently.   O: BP (!) 163/96 (BP Location: Left Arm)   Pulse (!) 102   Temp 99.8 F (37.7 C) (Oral)   Resp 20   Ht 5\' 7"  (1.702 m)   Wt 71.4 kg   SpO2 99%   BMI 24.65 kg/m   General: NAD, pleasant, laying in bed on RA ENTM: Moist mucous membranes Cardiovascular: RRR, no m/r/g, no LE edema Respiratory: mild expiratory wheeze in bases, no crackles/rhonchi noted, decreased breath sound in RUL, normal work of breathing MSK: moves 4 extremities equally Derm: no rashes appreciated Neuro: CN II-XII grossly intact Psych: AOx3, appropriate affect   A/P: - MRSA by PCR negative, will d/c vancomycin. - Will add metronidazole for anaerobic coverage.  - Crucial that patient give sputum sample, if unable to produce on patient's own, may need to have induced sputum.  - albuterol prn  Yuta Cipollone, [place], DO 07/11/2018, 10:57 AM PGY-2, [facility] Family Medicine Service pager [phone]

## 2018-07-12 DIAGNOSIS — J984 Other disorders of lung: Secondary | ICD-10-CM | POA: Diagnosis not present

## 2018-07-12 DIAGNOSIS — J851 Abscess of lung with pneumonia: Secondary | ICD-10-CM | POA: Diagnosis not present

## 2018-07-12 DIAGNOSIS — F1020 Alcohol dependence, uncomplicated: Secondary | ICD-10-CM | POA: Diagnosis not present

## 2018-07-12 DIAGNOSIS — K029 Dental caries, unspecified: Secondary | ICD-10-CM

## 2018-07-12 DIAGNOSIS — K0889 Other specified disorders of teeth and supporting structures: Secondary | ICD-10-CM | POA: Diagnosis not present

## 2018-07-12 DIAGNOSIS — H409 Unspecified glaucoma: Secondary | ICD-10-CM | POA: Diagnosis not present

## 2018-07-12 DIAGNOSIS — E876 Hypokalemia: Secondary | ICD-10-CM | POA: Diagnosis not present

## 2018-07-12 DIAGNOSIS — J189 Pneumonia, unspecified organism: Secondary | ICD-10-CM | POA: Diagnosis not present

## 2018-07-12 LAB — CBC
HCT: 38.9 % — ABNORMAL LOW (ref 39.0–52.0)
Hemoglobin: 12.9 g/dL — ABNORMAL LOW (ref 13.0–17.0)
MCH: 28.5 pg (ref 26.0–34.0)
MCHC: 33.2 g/dL (ref 30.0–36.0)
MCV: 85.9 fL (ref 80.0–100.0)
Platelets: 292 10*3/uL (ref 150–400)
RBC: 4.53 MIL/uL (ref 4.22–5.81)
RDW: 13 % (ref 11.5–15.5)
WBC: 11.5 10*3/uL — ABNORMAL HIGH (ref 4.0–10.5)
nRBC: 0 % (ref 0.0–0.2)

## 2018-07-12 LAB — BASIC METABOLIC PANEL
Anion gap: 8 (ref 5–15)
BUN: <5 mg/dL — ABNORMAL LOW (ref 6–20)
CO2: 26 mmol/L (ref 22–32)
Calcium: 8.7 mg/dL — ABNORMAL LOW (ref 8.9–10.3)
Chloride: 103 mmol/L (ref 98–111)
Creatinine, Ser: 0.87 mg/dL (ref 0.61–1.24)
GFR calc Af Amer: >60 mL/min (ref >60)
GFR calc non Af Amer: >60 mL/min (ref >60)
Glucose, Bld: 115 mg/dL — ABNORMAL HIGH (ref 70–99)
Potassium: 3.2 mmol/L — ABNORMAL LOW (ref 3.5–5.1)
Sodium: 137 mmol/L (ref 135–145)

## 2018-07-12 LAB — QUANTIFERON-TB GOLD PLUS: QuantiFERON-TB Gold Plus: NEGATIVE

## 2018-07-12 LAB — HIV ANTIBODY (ROUTINE TESTING W REFLEX): HIV Screen 4th Generation wRfx: NONREACTIVE

## 2018-07-12 LAB — QUANTIFERON-TB GOLD PLUS (RQFGPL)
QuantiFERON Mitogen Value: >10 IU/mL
QuantiFERON Nil Value: 0.01 IU/mL
QuantiFERON TB1 Ag Value: 0.01 IU/mL
QuantiFERON TB2 Ag Value: 0.01 IU/mL

## 2018-07-12 MED ORDER — AMOXICILLIN-POT CLAVULANATE 875-125 MG PO TABS: 1.0000 | ORAL_TABLET | Freq: Two times a day (BID) | ORAL | 0 refills | Status: DC

## 2018-07-12 MED ORDER — POTASSIUM CHLORIDE CRYS ER 20 MEQ PO TBCR
40.0000 meq | EXTENDED_RELEASE_TABLET | Freq: Once | ORAL | Status: AC
  Administered 2018-07-12: 10:00:00 40 meq via ORAL
  Filled 2018-07-12: qty 2

## 2018-07-12 MED ORDER — SODIUM CHLORIDE 0.9 % IV SOLN
3.0000 g | Freq: Four times a day (QID) | INTRAVENOUS | Status: DC
  Filled 2018-07-12 (×4): qty 3

## 2018-07-12 MED ORDER — AMOXICILLIN-POT CLAVULANATE 875-125 MG PO TABS: 1.0000 | ORAL_TABLET | Freq: Two times a day (BID) | ORAL | Status: DC

## 2018-07-12 MED ORDER — AMOXICILLIN-POT CLAVULANATE ER 1000-62.5 MG PO TB12: 2.0000 | ORAL_TABLET | Freq: Two times a day (BID) | ORAL | Status: DC

## 2018-07-12 MED FILL — AMOX-CLAV 875-125 MG TABLET: 875-125 | 30 days supply | Qty: 60 | Fill #0

## 2018-07-12 NOTE — Progress Notes (Signed)
ID Pharmacy Note   A 60 day supply of Augmentin has been prepared at the [facility] and will be delivered to patient prior to discharge.   Sharin Mons, PharmD, BCPS, BCIDP Infectious Diseases Clinical Pharmacist Phone: [phone]

## 2018-07-12 NOTE — Progress Notes (Signed)
Family Medicine Teaching Service Daily Progress Note Intern Pager: [phone]  Patient name: Edward Delgado Medical record number: [id number] Date of birth: [birth year] Age: 53 y.o. Gender: male  Primary Care Provider: Patient, No Pcp Per Consultants: none Code Status: Full code  Pt Overview and Major Events to Date:  Hospital Day 1 Admitted: 07/11/2018  Assessment and Plan: Edward Delgado is a 53 y.o. male who presented with productive cough and R sided chest pain for three days, found to have R middle lobe cavitary lesion concerning for TB vs necrotizing PNA. Marland Kitchen PMHx   R middle lobe PNA w/ cavitation - This morning on room air and afebrile. Still having R sided chest pain when he coughs. Not tender to palpation. Wbc 17.0>11.5. cavitation/necrosis on middle right lung.  CTA showed thick walled cavity with fluid level.  Complaining of black sputum. MRSA pcr neg. DDx includes TB vs aspiration PNA. -  Metronidazole (4/2-) and cefepime.   - repeat CXR on 4/4 - f/u acid fast sputum.  - consult ID - tramadol q6h prn - f/u quant gold - g/u blood and sputum cultures  Hypokalemia - 3.3>3.2. mag and phos normal.   - gave KDur  Hypocalcemia - 8.6>8.7.  Corrected calcium 9.58. likely 2/2 hypoalbuminemia.   Glaucoma - latanoprost and marijuana at home - continue home eye drops  FEN/GI: PIV, regular diet, airborne precautions.  DVT: lovenox  Disposition: home   Medications: Scheduled Meds: . enoxaparin (LOVENOX) injection  40 mg Subcutaneous Q24H  . latanoprost  1 drop Both Eyes QHS   Continuous Infusions: . ceFEPime (MAXIPIME) IV 2 g (07/12/18 0531)  . metronidazole 500 mg (07/12/18 0437)   PRN Meds: acetaminophen **OR** acetaminophen, albuterol, polyethylene glycol, traMADol  ================================================= ================================================= Subjective:  Patient stating his chest still hurts on the right side, but only when he coughs.   He is still coughing up black sputum he states.  Otherwise he has no complaints.   Objective: Temp:  [98.9 F (37.2 C)-100.6 F (38.1 C)] 99.1 F (37.3 C) (04/03 0616) Pulse Rate:  [91-102] 91 (04/03 0616) Resp:  [18-22] 18 (04/03 0616) BP: (137-163)/(83-96) 137/89 (04/03 0616) SpO2:  [97 %-99 %] 98 % (04/03 0616) Weight:  [71.4 kg] 71.4 kg (04/02 0943) Intake/Output 04/02 0701 - 04/03 0700 In: 559.9 [P.O.:450; IV Piggyback:109.9] Out: 400 [Urine:400] Physical Exam:  Gen: NAD, alert, non-toxic, well-nourished, well-appearing, sitting comfortably  CV: Regular rate and rhythm. Radial pulses 2+ bilaterally. No bilateral lower extremity edema. Resp: right side normal lung sounds.  Left side has decreased air movement, no crackles or wheezing. No increased work of breathing appreciated. On room air.  Abd: Nontender and nondistended on palpation to all 4 quadrants.  Positive bowel sounds. Psych: Cooperative with exam. Pleasant. Makes eye contact. Extremities: Full ROM    Laboratory: Recent Labs  Lab 07/11/18 0228 07/12/18 0236  WBC 17.0* 11.5*  HGB 13.8 12.9*  HCT 41.9 38.9*  PLT 272 292   Recent Labs  Lab 07/11/18 0228 07/12/18 0236  NA 135 137  K 3.3* 3.2*  CL 101 103  CO2 24 26  BUN 8 <5*  CREATININE 0.86 0.87  CALCIUM 8.6* 8.7*  GLUCOSE 126* 115*    Imaging/Diagnostic Tests: Dg Chest 2 View  Result Date: 07/11/2018 CLINICAL DATA:  Chest pain for a week, cough for 3 days. EXAM: CHEST - 2 VIEW COMPARISON:  Chest radiograph September 19, 2009 FINDINGS: RIGHT mid lung zone consolidation with central lucency. No pleural effusion. Cardiac  silhouette is normal. No pneumothorax. Soft tissue planes included osseous structures are unremarkable. IMPRESSION: RIGHT mid lung zone pneumonia with cavitation/necrosis, abscess possible. Followup PA and lateral chest X-ray is recommended in 3-4 weeks following trial of antibiotic therapy to ensure resolution and exclude underlying  malignancy. Electronically Signed   By: Awilda Metro M.D.   On: 07/11/2018 03:07   Ct Angio Chest Pe W And/or Wo Contrast  Result Date: 07/11/2018 CLINICAL DATA:  Hemoptysis and congestion beginning 4 days ago EXAM: CT ANGIOGRAPHY CHEST WITH CONTRAST TECHNIQUE: Multidetector CT imaging of the chest was performed using the standard protocol during bolus administration of intravenous contrast. Multiplanar CT image reconstructions and MIPs were obtained to evaluate the vascular anatomy. CONTRAST:  59mL ISOVUE-370 IOPAMIDOL (ISOVUE-370) INJECTION 76% COMPARISON:  Chest x-ray earlier today FINDINGS: Cardiovascular: Satisfactory opacification of the pulmonary arteries to the segmental level. No evidence of pulmonary embolism. Normal heart size. No pericardial effusion. Mediastinum/Nodes: Mild prominence of right hilar lymph nodes considered reactive in this setting. These nodes show no visible cavitation Lungs/Pleura: Consolidation in the right middle lobe with central cavitation and fluid level. The area of consolidation measures up to 9 cm in the area of cavitation up to 3.6 cm. Trace pleural fluid on the right. No calcified or other pulmonary nodules to imply cavitating metastatic disease, prior granulomatous infection, or noninfective granuloma. Upper Abdomen: Negative Musculoskeletal: No acute finding. These results were called by telephone at the time of interpretation on 07/11/2018 at 4:18 am to Dr. Antony Madura , who verbally acknowledged these results. Review of the MIP images confirms the above findings. IMPRESSION: 1. Right middle lobe thick walled cavity with fluid level and regional consolidation. Primary differential considerations are necrotizing pneumonia, tuberculosis, and less likely malignancy. Recommend imaging follow-up to normalization. 2. Negative for pulmonary embolism. Electronically Signed   By: Marnee Spring M.D.   On: 07/11/2018 04:18      Sandre Kitty, MD 07/12/2018, 6:33  AM PGY-1, [facility] Family Medicine FPTS Intern pager: [phone], text pages welcome

## 2018-07-12 NOTE — Consult Note (Addendum)
Date of Admission:  07/11/2018          Reason for Consult:  Cavitary pneumonia with air fluid level in RML   Referring Provider: Dr.   Assessment:   1. Cavitary pneumonia with air fluid level in RML c/w lung abscess  2. Alcoholism 3. Poor dentition  Plan:  1. Would switch to Augmentin 875/125 mg BID x 2 months 2. Would DC him to home with a Face Mask and ensure he stays at home until we have been able to see results from AFB smears 3. I have scheduled him for E VISIT with me on May 6th, 2020 @ 930 AM 4.  repeat CXR in 1-2 months (assuming this is lung abscess)  Active Problems:   Pneumonia with cavity of lung   Hypokalemia   Glaucoma of both eyes   Scheduled Meds:  enoxaparin (LOVENOX) injection  40 mg Subcutaneous Q24H   latanoprost  1 drop Both Eyes QHS   Continuous Infusions:  ampicillin-sulbactam (UNASYN) IV     PRN Meds:.acetaminophen **OR** acetaminophen, albuterol, polyethylene glycol, traMADol  HPI: Edward Delgado is a 53 y.o. male with history of poor dentition with multiple missing teeth and caries, alcoholism marijuana use who had developed chest pain after he had been lifting 100 pounds worth of sheet metal roughly 1 week ago.  He was certain that this was due to his lifting the sheet-metal but then was also developing a cough as well as night sweats.  He came to the emergency department and was evaluated.  A d-dimer was positive and he had a CT angiogram which showed no evidence of pulmonary embolism.  However it did show a right middle lobe cavitary lesion with an air-fluid level.  He was admitted to the teaching service and started on cefepime and metronidazole.  AFB smears have been collected to rule out tuberculosis.  The patient himself has not had known contact with anyone with tuberculosis.  He is resides in [place] and has not traveled further [place] then [place] nor further [place] then [place].  He has not been to the [facility] or abroad ever.  He is never been incarcerated he has not been homeless.  He is HIV negative.  On exam he is nontoxic-appearing and not requiring oxygen.  He is eager to go home today.  I checked with the microbiology lab and [facility] would be looking at his AFB smears sometime in the next several days.  My clinical suspicion for tuberculosis is low.  I would not keep him in the hospital to subject him to bronchoscopy.  Rather I would convert him to oral Augmentin and give him 45-month supply of this antibiotic.   Because we do not have his AFB smears back I would send him home with a mask with instructions to remain in the home until called by [place].  I will keep him on our list so that we can follow-up his AFB smears.  If his AFB smears are negative then he will not need isolation from a TB standpoint.  Given his cough though he is not likely gone I want to be going back to work immediately.  If his AP smears are positive I will contact the health department to initiate treatment for Mycobacterium tuberculosis.  I will schedule an ED visit for him with me in roughly 1 month's time.  He will need repeat imaging in the next 1 to 2 months.  Review of Systems:  Review of Systems  Constitutional: Negative for chills, fever, malaise/fatigue and weight loss.  HENT: Negative for congestion and sore throat.   Eyes: Negative for blurred vision and photophobia.  Respiratory: Positive for cough. Negative for shortness of breath and wheezing.   Cardiovascular: Positive for chest pain. Negative for palpitations and leg swelling.  Gastrointestinal: Negative for abdominal pain, blood in stool, constipation, diarrhea, heartburn, melena, nausea and vomiting.  Genitourinary: Negative for dysuria, flank pain and hematuria.  Musculoskeletal: Negative for back pain, falls, joint pain and myalgias.  Skin: Negative for itching and rash.  Neurological: Negative for dizziness, focal weakness, loss  of consciousness, weakness and headaches.  Endo/Heme/Allergies: Does not bruise/bleed easily.  Psychiatric/Behavioral: Negative for depression and suicidal ideas. The patient does not have insomnia.     History reviewed. No pertinent past medical history.  Social History   Tobacco Use   Smoking status: Never Smoker   Smokeless tobacco: Never Used  Substance Use Topics   Alcohol use: Yes    Comment: occ   Drug use: No    No family history on file. No Known Allergies  OBJECTIVE: Blood pressure (!) 146/85, pulse 90, temperature 98.8 F (37.1 C), temperature source Oral, resp. rate 18, height 5\' 7"  (1.702 m), weight 71.4 kg, SpO2 98 %.  Physical Exam Constitutional:      Appearance: He is well-developed.  HENT:     Head: Normocephalic and atraumatic.     Mouth/Throat:     Lips: Pink.     Mouth: Mucous membranes are moist.     Dentition: Dental caries present.  Eyes:     Conjunctiva/sclera: Conjunctivae normal.  Neck:     Musculoskeletal: Normal range of motion and neck supple.  Cardiovascular:     Rate and Rhythm: Normal rate and regular rhythm.     Heart sounds: No murmur. No friction rub. No gallop.   Pulmonary:     Effort: Pulmonary effort is normal. No respiratory distress.     Breath sounds: Examination of the right-middle field reveals decreased breath sounds. Examination of the right-lower field reveals decreased breath sounds. Decreased breath sounds present. No wheezing.  Abdominal:     General: There is no distension.     Palpations: Abdomen is soft.  Musculoskeletal: Normal range of motion.        General: No tenderness.  Skin:    General: Skin is warm and dry.     Coloration: Skin is not pale.     Findings: No erythema or rash.  Neurological:     Mental Status: He is alert and oriented to person, place, and time.     Lab Results Lab Results  Component Value Date   WBC 11.5 (H) 07/12/2018   HGB 12.9 (L) 07/12/2018   HCT 38.9 (L) 07/12/2018    MCV 85.9 07/12/2018   PLT 292 07/12/2018    Lab Results  Component Value Date   CREATININE 0.87 07/12/2018   BUN <5 (L) 07/12/2018   NA 137 07/12/2018   K 3.2 (L) 07/12/2018   CL 103 07/12/2018   CO2 26 07/12/2018   No results found for: ALT, AST, GGT, ALKPHOS, BILITOT   Microbiology: Recent Results (from the past 240 hour(s))  MRSA PCR Screening     Status: None   Collection Time: 07/11/18  4:22 AM  Result Value Ref Range Status   MRSA by PCR NEGATIVE NEGATIVE Final    Comment:        The GeneXpert MRSA Assay (  FDA approved for NASAL specimens only), is one component of a comprehensive MRSA colonization surveillance program. It is not intended to diagnose MRSA infection nor to guide or monitor treatment for MRSA infections. Performed at [facility], [address] [place], [place] [address]   Culture, blood (Routine X 2) w Reflex to ID Panel     Status: None (Preliminary result)   Collection Time: 07/11/18  4:39 AM  Result Value Ref Range Status   Specimen Description BLOOD RIGHT ANTECUBITAL  Final   Special Requests   Final    BOTTLES DRAWN AEROBIC AND ANAEROBIC Blood Culture adequate volume   Culture   Final    NO GROWTH 1 DAY Performed at [facility], [address], [place], [place] [address]    Report Status PENDING  Incomplete  Culture, blood (Routine X 2) w Reflex to ID Panel     Status: None (Preliminary result)   Collection Time: 07/11/18  4:39 AM  Result Value Ref Range Status   Specimen Description BLOOD RIGHT HAND  Final   Special Requests   Final    BOTTLES DRAWN AEROBIC AND ANAEROBIC Blood Culture adequate volume   Culture   Final    NO GROWTH 1 DAY Performed at [facility], [address] [place], [place] [address]    Report Status PENDING  Incomplete    Acey Lav, MD [facility] [phone] pager  07/12/2018, 2:26 PM

## 2018-07-12 NOTE — Discharge Instructions (Signed)
Please take your antibiotics twice a day for two months.  Dr. Daiva Eves will notify you of your tests for tuberculosis.  These should be available sometime next week.  Please stay at home and wear a mask until someone has confirmed with you that these results are negative. If you are told they are negative you can then return to work.  If they are positive for tuberculosis you would need to start taking different antibiotics.  The health department will help you with this.    Please return to the hospital if you start having difficulty breathing or if you start to develop a fever while taking the antibiotics.    You have an appointment with Dr. Daiva Eves on May 6th.     Aspiration Pneumonia Aspiration pneumonia is an infection in the lungs. It occurs when saliva or liquid contaminated with bacteria is inhaled (aspirated) into the lungs. When these things get into the lungs, swelling (inflammation) and infection can occur. This can make it difficult to breathe. Aspiration pneumonia is a serious condition and can be life threatening. What are the causes? This condition is caused when saliva or liquid from the mouth, throat, or stomach is inhaled into the lungs, and when those fluids are contaminated with bacteria. What increases the risk? The following factors may make you more likely to develop this condition:  A narrowing of the tube that carries food to the stomach (esophageal narrowing).  Having gastroesophageal reflux disease (GERD).  Having a weak immune system.  Having diabetes.  Having poor oral hygiene.  Being malnourished. The condition is more likely to occur when a person's cough (gag) reflex, or ability to swallow, has decreased. Some things that can cause this decrease include:  Having a brain injury or disease, such as stroke, seizures, Parkinson disease, dementia, or amyotrophic lateral sclerosis (ALS).  Being given a general anesthetic for procedures.  Drinking too much  alcohol. If a person passes out and vomits, vomit can be inhaled into the lungs.  Taking certain medicines, such as tranquilizers or sedatives. What are the signs or symptoms? Symptoms of this condition include:  Fever.  A cough with secretions that are yellow, tan, or green.  Breathing problems, such as wheezing or shortness of breath.  Chest pain.  Being more tired than usual (fatigue).  Having a history of coughing while eating or drinking.  Bad breath.  Bluish color to the lips, skin, or fingers. How is this diagnosed? This condition may be diagnosed based on:  A physical exam.  Tests, such as: ? Chest X-ray. ? Sputum culture. Saliva and mucus (sputum) are collected from the lungs or the tubes that carry air to the lungs (bronchi). The sputum is then tested for bacteria. ? Oximetry. A sensor or clip is placed on areas such as a finger, earlobe, or toe to measure the oxygen level in your blood. ? Blood tests. ? Swallowing study. This test looks at how food is swallowed and whether it goes into your breathing tube (trachea) or esophagus. ? Bronchoscopy. This test uses a flexible tube (bronchoscope) to see inside the lungs. How is this treated? This condition may be treated with:  Medicines. Antibiotic medicine will be given to kill the pneumonia bacteria. Other medicines may also be used to reduce fever or pain.  Breathing assistance and oxygen therapy. Depending on how well you are breathing, you may need to be given oxygen, or you may need breathing support from a breathing machine (ventilator).  Thoracentesis. This is a procedure to remove fluid that has built up in the space between the linings of the chest wall and the lungs.  Feeding tube and diet change. For people who have difficulty swallowing, a feeding tube might be placed in the stomach, or they may be asked to avoid certain food textures or liquids when eating. Follow these instructions at  home: Medicines  Take over-the-counter and prescription medicines only as told by your health care provider. ? If you were prescribed an antibiotic medicine, take it as told by your health care provider. Do not stop taking the antibiotic even if you start to feel better. ? Take cough medicine only if you are losing sleep. Cough medicine can prevent your bodys natural ability to remove mucus from your lungs. General instructions  Carefully follow any eating instructions you were given, such as avoiding certain food textures or thickening your liquids. Thickening liquids reduces the risk of developing aspiration pneumonia again.  Use breathing exercises such as postural drainage, deep breathing, and incentive spirometry to help expel secretions.  Rest as instructed by your health care provider.  Sleep in a semi-upright position at night. Try to sleep in a reclining chair, or place a few pillows under your head.  Do not use any products that contain nicotine or tobacco, such as cigarettes and e-cigarettes. If you need help quitting, ask your health care provider.  Keep all follow-up visits as told by your health care provider. This is important. Contact a health care provider if:  You have a fever.  You have a worsening cough with yellow, tan, or green secretions.  You have coughing while eating or drinking. Get help right away if:  You have worsening shortness of breath, wheezing, or difficulty breathing.  You have chest pain. Summary  Aspiration pneumonia is an infection in the lungs. It is caused when saliva or liquid from the mouth, throat, or stomach is inhaled into the lungs.  Aspiration pneumonia is more likely to occur when a person's cough reflex or ability to swallow has decreased.  Symptoms of aspiration pneumonia include coughing, breathing problems, fever, and chest pain.  Aspiration pneumonia may be treated with antibiotic medicine, other medicines to reduce pain or  fever, and breathing assistance or oxygen therapy. This information is not intended to replace advice given to you by your health care provider. Make sure you discuss any questions you have with your health care provider. Document Released: 01/22/2009 Document Revised: 05/02/2016 Document Reviewed: 05/02/2016 Elsevier Interactive Patient Education  2019 [employer].

## 2018-07-16 LAB — CULTURE, BLOOD (ROUTINE X 2)
Culture: NO GROWTH
Culture: NO GROWTH
Special Requests: ADEQUATE
Special Requests: ADEQUATE

## 2018-07-16 LAB — ACID FAST SMEAR (AFB, MYCOBACTERIA): Acid Fast Smear: NEGATIVE

## 2018-08-14 ENCOUNTER — Other Ambulatory Visit: Payer: Self-pay

## 2018-08-14 ENCOUNTER — Ambulatory Visit (INDEPENDENT_AMBULATORY_CARE_PROVIDER_SITE_OTHER): Payer: 59 | Admitting: Infectious Disease

## 2018-08-14 DIAGNOSIS — J984 Other disorders of lung: Secondary | ICD-10-CM

## 2018-08-14 DIAGNOSIS — J189 Pneumonia, unspecified organism: Secondary | ICD-10-CM

## 2018-08-14 MED ORDER — AMOXICILLIN-POT CLAVULANATE 875-125 MG PO TABS: 1.0000 | ORAL_TABLET | Freq: Two times a day (BID) | ORAL | 0 refills | Status: DC

## 2018-08-14 NOTE — Progress Notes (Signed)
Virtual Visit via Telephone Note  I connected with Edward Delgado on 08/14/18 at  9:30 AM EDT by telephone and verified that I am speaking with the correct person using two identifiers.  Location: Patient:  Home Provider: [facility]   I discussed the limitations, risks, security and privacy concerns of performing an evaluation and management service by telephone and the availability of in person appointments. I also discussed with the patient that there may be a patient responsible charge related to this service. The patient expressed understanding and agreed to proceed.   History of Present Illness:   53 y.o. male with history of poor dentition with multiple missing teeth and caries, alcoholism marijuana use who had developed chest pain after he had been lifting 100 pounds worth of sheet metal roughly 1 week ago.  He was certain that this was due to his lifting the sheet-metal but then was also developing a cough as well as night sweats.  He came to the emergency department and was evaluated.  A d-dimer was positive and he had a CT angiogram which showed no evidence of pulmonary embolism.  However it did show a right middle lobe cavitary lesion with an air-fluid level.  He was admitted to the teaching service and started on cefepime and metronidazole.  AFB smears were collected to rule out tuberculosis.  The patient himself has not had known contact with anyone with tuberculosis.  He is resides in [place] and has not traveled further [place] then [place] nor further [place] then [place].  He has not been to the [facility] or abroad ever.  If the smears were subsequently negative and AFB cultures still incubating.  I felt strongly he did not have tuberculosis but rather a lung abscess and switch him over to Augmentin.  He was guided with a 60-day supply by infectious these pharmacy but claims he is out of medications already and they ran out on Sunday.  His cough is resolved  he has no more chest pain and feels well.  He is continuing to work at his place of employment and claims he has sufficient distance from his coworkers and is wearing a mask and gloves.  He otherwise lives at home with his wife who is currently not employed.  He is without fevers chills or other systemic symptoms   Observations/Objective:  Lung abscess:  Rule out TB  Prevention of CoVID 19  Assessment and Plan:  Lung abscess: Doing Augmentin.  I am getting a chest x-ray in roughly a month's time and have an electronic visit shortly thereafter  The culture should finalize shortly I have very confident he does not have tuberculosis.  He will continue to try to shelter in place  Follow Up Instructions:    I discussed the assessment and treatment plan with the patient. The patient was provided an opportunity to ask questions and all were answered. The patient agreed with the plan and demonstrated an understanding of the instructions.   The patient was advised to call back or seek an in-person evaluation if the symptoms worsen or if the condition fails to improve as anticipated.  I provided 15 minutes of non-face-to-face time during this encounter.   Acey Lav, MD

## 2018-08-24 LAB — ACID FAST CULTURE WITH REFLEXED SENSITIVITIES (MYCOBACTERIA): Acid Fast Culture: NEGATIVE

## 2018-08-26 LAB — ACID FAST SMEAR (AFB, MYCOBACTERIA): Acid Fast Smear: NEGATIVE

## 2018-08-28 LAB — ACID FAST SMEAR (AFB, MYCOBACTERIA): Acid Fast Smear: NEGATIVE

## 2018-09-23 ENCOUNTER — Encounter: Payer: Self-pay | Admitting: Infectious Disease

## 2018-09-23 ENCOUNTER — Other Ambulatory Visit: Payer: Self-pay

## 2018-09-23 ENCOUNTER — Ambulatory Visit (INDEPENDENT_AMBULATORY_CARE_PROVIDER_SITE_OTHER): Payer: 59 | Admitting: Infectious Disease

## 2018-09-23 DIAGNOSIS — J984 Other disorders of lung: Secondary | ICD-10-CM

## 2018-09-23 DIAGNOSIS — K089 Disorder of teeth and supporting structures, unspecified: Secondary | ICD-10-CM

## 2018-09-23 DIAGNOSIS — Z7289 Other problems related to lifestyle: Secondary | ICD-10-CM | POA: Diagnosis not present

## 2018-09-23 DIAGNOSIS — J189 Pneumonia, unspecified organism: Secondary | ICD-10-CM

## 2018-09-23 DIAGNOSIS — Z789 Other specified health status: Secondary | ICD-10-CM

## 2018-09-23 DIAGNOSIS — F1090 Alcohol use, unspecified, uncomplicated: Secondary | ICD-10-CM | POA: Insufficient documentation

## 2018-09-23 HISTORY — DX: Other problems related to lifestyle: Z72.89

## 2018-09-23 HISTORY — DX: Alcohol use, unspecified, uncomplicated: F10.90

## 2018-09-23 HISTORY — DX: Disorder of teeth and supporting structures, unspecified: K08.9

## 2018-09-23 HISTORY — DX: Other specified health status: Z78.9

## 2018-09-23 NOTE — Progress Notes (Signed)
Virtual Visit via Telephone Note  I connected with Edward Delgado on 09/23/18 at  9:45 AM EDT by telephone and verified that I am speaking with the correct person using two identifiers.  Location: Patient:  Home Provider: [facility]   I discussed the limitations, risks, security and privacy concerns of performing an evaluation and management service by telephone and the availability of in person appointments. I also discussed with the patient that there may be a patient responsible charge related to this service. The patient expressed understanding and agreed to proceed.   History of Present Illness:   53 y.o. male with history of poor dentition with multiple missing teeth and caries, alcoholism marijuana use who had developed chest pain after he had been lifting 100 pounds worth of sheet metal roughly 1 week ago.  He was certain that this was due to his lifting the sheet-metal but then was also developing a cough as well as night sweats.  He came to the emergency department and was evaluated.  A d-dimer was positive and he had a CT angiogram which showed no evidence of pulmonary embolism.  However it did show a right middle lobe cavitary lesion with an air-fluid level.  He was admitted to the teaching service and started on cefepime and metronidazole.  AFB smears were collected to rule out tuberculosis.  The patient himself has not had known contact with anyone with tuberculosis.  He is resides in [place] and has not traveled further [place] then [place] nor further [place] then [place].  He has not been to the [facility] [place] or abroad ever.  If the smears were subsequently negative and AFB cultures still incubating.  I felt strongly he did not have tuberculosis but rather a lung abscess and switch him over to Augmentin.  He was guided with a 60-day supply by infectious these pharmacy but claimed he is out of medications already and they ran out on Sunday.  His cough had   resolved he has no more chest pain and feels well.  He is continuing to work at his place of employment and claims he has sufficient distance from his coworkers and is wearing a mask and gloves.  He otherwise lives at home with his wife who is currently not employed.  He is without fevers chills or other systemic symptoms  Talk to him roughly a month ago and he is continued to feel well without return of cough or fevers or chills.  He has no chest pain at all is working back at his job in full time capacity.  We have not had follow-up chest x-ray has had wished but I am ordering one today.  We will get one next week and then I will conduct an E visit with him shortly thereafter  Observations/Objective:  Lung abscess:  TB is ruled out  Prevention of CoVID 19  Alcoholism:  Poor dentition   Assessment and Plan:  Lung abscess: Pleated Augmentin I will get a chest x-ray  2 AFB cultures were both negative He will continue to try to shelter in place  : He minimizes his beer intake says he drinks only 3 light beers per day.  I asked him to reduce this if possible  I have asked him to get in touch with a dentist to have his teeth attended to because I can have concerns that his aspiration pneumonia with an abundance of bacteria in his oropharynx due to his bad dentition could recur.  Follow Up Instructions:    I discussed the assessment and treatment plan with the patient. The patient was provided an opportunity to ask questions and all were answered. The patient agreed with the plan and demonstrated an understanding of the instructions.   The patient was advised to call back or seek an in-person evaluation if the symptoms worsen or if the condition fails to improve as anticipated.  I provided 12 minutes of non-face-to-face time during this encounter.   Alcide Evener, MD

## 2018-10-08 ENCOUNTER — Ambulatory Visit: Payer: 59 | Admitting: Infectious Disease

## 2018-10-08 ENCOUNTER — Other Ambulatory Visit: Payer: Self-pay

## 2018-10-30 LAB — ACID FAST CULTURE WITH REFLEXED SENSITIVITIES (MYCOBACTERIA)
Acid Fast Culture: NEGATIVE
Acid Fast Culture: NEGATIVE

## 2018-12-10 DEATH — deceased
